# Patient Record
Sex: Male | Born: 1955 | Race: Asian | Hispanic: No | Smoking: Former smoker
Health system: Southern US, Community
[De-identification: ages and names within clinical notes are randomized; demographics above are authoritative.]

## PROBLEM LIST (undated history)

## (undated) DIAGNOSIS — T7840XA Allergy, unspecified, initial encounter: Secondary | ICD-10-CM

## (undated) HISTORY — DX: Allergy, unspecified, initial encounter: T78.40XA

## (undated) HISTORY — PX: PROSTATE SURGERY: SHX751

---

## 2011-12-04 ENCOUNTER — Ambulatory Visit (INDEPENDENT_AMBULATORY_CARE_PROVIDER_SITE_OTHER): Payer: PRIVATE HEALTH INSURANCE | Admitting: Family Medicine

## 2011-12-04 VITALS — BP 121/73 | HR 64 | Temp 97.5°F | Resp 16 | Ht 66.0 in | Wt 156.0 lb

## 2011-12-04 DIAGNOSIS — J309 Allergic rhinitis, unspecified: Secondary | ICD-10-CM

## 2011-12-04 DIAGNOSIS — J0100 Acute maxillary sinusitis, unspecified: Secondary | ICD-10-CM

## 2011-12-04 MED ORDER — FLUTICASONE PROPIONATE 50 MCG/ACT NA SUSP: NASAL | Status: DC

## 2011-12-04 MED ORDER — AMOXICILLIN-POT CLAVULANATE 875-125 MG PO TABS: 1.0000 | ORAL_TABLET | Freq: Two times a day (BID) | ORAL | Status: DC

## 2011-12-04 NOTE — Progress Notes (Signed)
   [address]   [place], [place]  [address]   [phone]  Subjective:    Patient ID: Peter Norman, male    DOB: July 08, 1955, 56 y.o.   MRN: [id number]  HPIThis 56 y.o. male presents for evaluation of cough.  Onset one month ago.  At time, took medication Tylenol for a few days with improvement.  Then recurrent symptoms for past month.  +fever Tmax low grade.  +sweats.  No chills.  +coughing  +ST scratchy but no ear pain.  No rhinorrhea; +nasal congestion; +congestion clear thin.  +sinus pressure.  History of allergic rhinitis; chronic allergies; +sneezing.  +PND.  +coughing; +sputum; no SOB.  No vomiting or diarrhea.  No rash.  Intermittent fatigue/malaise.  Takes Claritin daily.  No recent travel in past year.  PMH:  Allergies Rhinitis Psurg:  None All:  NKDA Medications: Claritin 10mg  daily Social:  Divorced; dating; 2 children; no grandchildren.  History of tobacco 1992.  +ETOH daily wine.  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     Review of Systems  Constitutional: Negative for fever, chills, diaphoresis and fatigue.  HENT: Positive for congestion, sore throat, sneezing, voice change, postnasal drip and sinus pressure. Negative for rhinorrhea and trouble swallowing.   Respiratory: Positive for cough. Negative for shortness of breath and wheezing.   Gastrointestinal: Negative for nausea, vomiting, abdominal pain and diarrhea.  Neurological: Negative for headaches.       Objective:   Physical Exam  Nursing note and vitals reviewed. Constitutional: He is oriented to person, place, and time. He appears well-developed and well-nourished. No distress.  HENT:  Head: Normocephalic and atraumatic.  Right Ear: External ear normal.  Left Ear: External ear normal.  Nose: Nose normal.  Mouth/Throat: Oropharynx is clear and moist. No oropharyngeal exudate.  Eyes: Conjunctivae normal and EOM are normal. Pupils are equal, round, and reactive to light.  Neck: Normal range of motion. Neck supple.    Cardiovascular: Normal rate, regular rhythm and normal heart sounds.   No murmur heard. Pulmonary/Chest: Effort normal and breath sounds normal. No respiratory distress. He has no wheezes. He has no rales.  Lymphadenopathy:    He has no cervical adenopathy.  Neurological: He is alert and oriented to person, place, and time. No cranial nerve deficit. He exhibits normal muscle tone.  Skin: Skin is warm and dry. No rash noted. He is not diaphoretic.  Psychiatric: He has a normal mood and affect. His behavior is normal. Judgment and thought content normal.       Assessment & Plan:   1. Allergic rhinitis, cause unspecified  fluticasone (FLONASE) 50 MCG/ACT nasal spray  2. Sinusitis, acute maxillary  amoxicillin-clavulanate (AUGMENTIN) 875-125 MG per tablet     1. Allergic Rhinitis: worsening; continue Claritin 10mg  daily; add Flonase; rx provided and instructed on use. 2.  Sinusitis Acute Maxillary:  New.  Rx for Augmentin provided.  Call if no improvement in two weeks.

## 2011-12-04 NOTE — Patient Instructions (Addendum)
1. Allergic rhinitis, cause unspecified  fluticasone (FLONASE) 50 MCG/ACT nasal spray  2. Sinusitis, acute maxillary  amoxicillin-clavulanate (AUGMENTIN) 875-125 MG per tablet   Sinusitis Sinuses are air pockets within the bones of your face. The growth of bacteria within a sinus leads to infection. The infection prevents the sinuses from draining. This infection is called sinusitis. SYMPTOMS  There will be different areas of pain depending on which sinuses have become infected.  The maxillary sinuses often produce pain beneath the eyes.   Frontal sinusitis may cause pain in the middle of the forehead and above the eyes.  Other problems (symptoms) include:  Toothaches.   Colored, pus-like (purulent) drainage from the nose.   Swelling, warmth, and tenderness over the sinus areas may be signs of infection.  TREATMENT  Sinusitis is most often determined by an exam.X-rays may be taken. If x-rays have been taken, make sure you obtain your results or find out how you are to obtain them. Your caregiver may give you medications (antibiotics). These are medications that will help kill the bacteria causing the infection. You may also be given a medication (decongestant) that helps to reduce sinus swelling.  HOME CARE INSTRUCTIONS   Only take over-the-counter or prescription medicines for pain, discomfort, or fever as directed by your caregiver.   Drink extra fluids. Fluids help thin the mucus so your sinuses can drain more easily.   Applying either moist heat or ice packs to the sinus areas may help relieve discomfort.   Use saline nasal sprays to help moisten your sinuses. The sprays can be found at your local drugstore.  SEEK IMMEDIATE MEDICAL CARE IF:  You have a fever.   You have increasing pain, severe headaches, or toothache.   You have nausea, vomiting, or drowsiness.   You develop unusual swelling around the face or trouble seeing.  MAKE SURE YOU:   Understand these  instructions.   Will watch your condition.   Will get help right away if you are not doing well or get worse.  Document Released: 03/01/2005 Document Revised: 02/18/2011 Document Reviewed: 09/28/2006 [facility] Patient Information 2012 [place], [place].

## 2011-12-06 NOTE — Progress Notes (Signed)
Reviewed and agree.

## 2012-03-29 ENCOUNTER — Encounter: Payer: Self-pay | Admitting: Internal Medicine

## 2012-03-29 ENCOUNTER — Ambulatory Visit (INDEPENDENT_AMBULATORY_CARE_PROVIDER_SITE_OTHER): Payer: PRIVATE HEALTH INSURANCE | Admitting: Internal Medicine

## 2012-03-29 VITALS — BP 129/79 | HR 79 | Temp 98.6°F | Resp 16 | Ht 67.0 in | Wt 154.0 lb

## 2012-03-29 DIAGNOSIS — J309 Allergic rhinitis, unspecified: Secondary | ICD-10-CM | POA: Insufficient documentation

## 2012-03-29 DIAGNOSIS — M779 Enthesopathy, unspecified: Secondary | ICD-10-CM

## 2012-03-29 DIAGNOSIS — Z0000 Encounter for general adult medical examination without abnormal findings: Secondary | ICD-10-CM

## 2012-03-29 DIAGNOSIS — J329 Chronic sinusitis, unspecified: Secondary | ICD-10-CM

## 2012-03-29 DIAGNOSIS — Z23 Encounter for immunization: Secondary | ICD-10-CM

## 2012-03-29 DIAGNOSIS — Z1211 Encounter for screening for malignant neoplasm of colon: Secondary | ICD-10-CM

## 2012-03-29 LAB — CBC WITH DIFFERENTIAL/PLATELET
Basophils Absolute: 0 10*3/uL (ref 0.0–0.1)
Eosinophils Absolute: 0.2 10*3/uL (ref 0.0–0.7)
Eosinophils Relative: 2 % (ref 0–5)
Lymphocytes Relative: 14 % (ref 12–46)
Lymphs Abs: 1.6 10*3/uL (ref 0.7–4.0)
Neutrophils Relative %: 74 % (ref 43–77)
Platelets: 233 10*3/uL (ref 150–400)
RBC: 5.07 MIL/uL (ref 4.22–5.81)
RDW: 13.2 % (ref 11.5–15.5)
WBC: 11.5 10*3/uL — ABNORMAL HIGH (ref 4.0–10.5)

## 2012-03-29 LAB — COMPREHENSIVE METABOLIC PANEL
ALT: 69 U/L — ABNORMAL HIGH (ref 0–53)
AST: 37 U/L (ref 0–37)
Albumin: 4.5 g/dL (ref 3.5–5.2)
CO2: 25 mEq/L (ref 19–32)
Calcium: 9.1 mg/dL (ref 8.4–10.5)
Chloride: 103 mEq/L (ref 96–112)
Potassium: 4 mEq/L (ref 3.5–5.3)
Sodium: 138 mEq/L (ref 135–145)
Total Protein: 7.2 g/dL (ref 6.0–8.3)

## 2012-03-29 LAB — LIPID PANEL
HDL: 61 mg/dL (ref >39)
LDL Cholesterol: 138 mg/dL — ABNORMAL HIGH (ref 0–99)
Triglycerides: 94 mg/dL (ref <150)

## 2012-03-29 LAB — IFOBT (OCCULT BLOOD): IFOBT: NEGATIVE

## 2012-03-29 LAB — POCT URINALYSIS DIPSTICK
Glucose, UA: NEGATIVE
Nitrite, UA: NEGATIVE
Protein, UA: NEGATIVE
Spec Grav, UA: 1.015
Urobilinogen, UA: 0.2
pH, UA: 7

## 2012-03-29 MED ORDER — AMOXICILLIN 875 MG PO TABS: 875.0000 mg | ORAL_TABLET | Freq: Two times a day (BID) | ORAL | Status: DC

## 2012-03-29 MED ORDER — CELECOXIB 200 MG PO CAPS: 200.0000 mg | ORAL_CAPSULE | Freq: Every day | ORAL | Status: DC

## 2012-03-29 NOTE — Progress Notes (Signed)
Subjective:    Patient ID: Peter Norman, male    DOB: Jan 18, 1956, 57 y.o.   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 Colonoscopy 12-14 years ago normal Review of Systems 13 point review of systems negative    Objective:   Physical Exam BP 129/79  Pulse 79  Temp 98.6 F (37 C)  Resp 16  Ht 5\' 7"  (1.702 m)  Wt 154 lb (69.854 kg)  BMI 24.12 kg/m2 No acute distress Pupils equal reactive to light and accommodation/wears glasses/EOMs conjugate TMs clear/ Nares boggy/purulent mucus Throat clear/dentition good No nodes or thyromegaly/neck supple Heart regular without murmurs rubs clicks or gallops Lungs clear Spine straight Abdomen soft with no organomegaly or masses Rectal with no masses Prostate soft and symmetrical Extremities clear Neurological intact Mood appropriate   Results for orders placed in visit on 03/29/12  CBC WITH DIFFERENTIAL      Component Value Range   WBC 11.5 (*) 4.0 - 10.5 K/uL   RBC 5.07  4.22 - 5.81 MIL/uL   Hemoglobin 15.9  13.0 - 17.0 g/dL   HCT 29.5  28.4 - 13.2 %   MCV 87.8  78.0 - 100.0 fL   MCH 31.4  26.0 - 34.0 pg   MCHC 35.7  30.0 - 36.0 g/dL   RDW 44.0  10.2 - 72.5 %   Platelets 233  150 - 400 K/uL   Neutrophils Relative 74  43 - 77 %   Neutro Abs 8.5 (*) 1.7 - 7.7 K/uL   Lymphocytes Relative 14  12 - 46 %   Lymphs Abs 1.6  0.7 - 4.0 K/uL   Monocytes Relative 10  3 - 12 %   Monocytes Absolute 1.1 (*) 0.1 - 1.0 K/uL   Eosinophils Relative 2  0 - 5 %   Eosinophils Absolute 0.2  0.0 - 0.7 K/uL   Basophils Relative 0  0 - 1 %   Basophils Absolute 0.0   0.0 - 0.1 K/uL   Smear Review Criteria for review not met        Component Value Range   IFOBT Negative    POCT URINALYSIS DIPSTICK      Component Value Range   Color, UA yellow     Clarity, UA clear     Glucose, UA neg     Bilirubin, UA neg     Ketones, UA neg     Spec Grav, UA 1.015     Blood, UA trace     pH, UA 7.0     Protein, UA neg     Urobilinogen, UA 0.2     Nitrite, UA neg     Leukocytes, UA Negative         Assessment & Plan:  Annual physical exam Problems 1 allergic rhinitis-doing well on Flonase and will continue that Problem #2 sinusitis-amoxicillin Problem 3 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 amoxicillin (AMOXIL) 875 MG tablet    Sig:  Take 1 tablet (875 mg total) by mouth 2 (two) times daily.    Dispense:  20 tablet    Refill:  0  . celecoxib (CELEBREX) 200 MG capsule    Sig: Take 1 capsule (200 mg total) by mouth daily.    Dispense:  30 capsule    Refill:  5

## 2012-03-30 ENCOUNTER — Encounter: Payer: Self-pay | Admitting: Internal Medicine

## 2012-03-30 LAB — PSA: PSA: 3.5 ng/mL (ref <=4.00)

## 2012-04-03 ENCOUNTER — Encounter: Payer: Self-pay | Admitting: Internal Medicine

## 2012-04-11 ENCOUNTER — Ambulatory Visit (AMBULATORY_SURGERY_CENTER): Payer: PRIVATE HEALTH INSURANCE | Admitting: *Deleted

## 2012-04-11 VITALS — Ht 67.0 in | Wt 160.0 lb

## 2012-04-11 DIAGNOSIS — Z1211 Encounter for screening for malignant neoplasm of colon: Secondary | ICD-10-CM

## 2012-04-11 MED ORDER — PEG-KCL-NACL-NASULF-NA ASC-C 100 G PO SOLR: ORAL | Status: DC

## 2012-04-11 NOTE — Progress Notes (Signed)
No egg or soy allergy 

## 2012-04-13 ENCOUNTER — Encounter: Payer: Self-pay | Admitting: Internal Medicine

## 2012-04-19 ENCOUNTER — Encounter: Payer: Self-pay | Admitting: Internal Medicine

## 2012-04-19 ENCOUNTER — Ambulatory Visit (AMBULATORY_SURGERY_CENTER): Payer: PRIVATE HEALTH INSURANCE | Admitting: Internal Medicine

## 2012-04-19 VITALS — BP 118/78 | HR 65 | Temp 97.7°F | Resp 20 | Ht 67.0 in | Wt 160.0 lb

## 2012-04-19 DIAGNOSIS — D126 Benign neoplasm of colon, unspecified: Secondary | ICD-10-CM

## 2012-04-19 DIAGNOSIS — Z1211 Encounter for screening for malignant neoplasm of colon: Secondary | ICD-10-CM

## 2012-04-19 MED ORDER — SODIUM CHLORIDE 0.9 % IV SOLN: 500.0000 mL | INTRAVENOUS | Status: DC

## 2012-04-19 NOTE — Progress Notes (Signed)
1635 a/ox3 pleased with MAC report to [employer]

## 2012-04-19 NOTE — Progress Notes (Signed)
Patient did not experience any of the following events: a burn prior to discharge; a fall within the facility; wrong site/side/patient/procedure/implant event; or a hospital transfer or hospital admission upon discharge from the facility. (G8907) Patient did not have preoperative order for IV antibiotic SSI prophylaxis. (G8918)  

## 2012-04-19 NOTE — Patient Instructions (Addendum)

## 2012-04-19 NOTE — Op Note (Signed)
[facility] [address] [place], [address]   COLONOSCOPY PROCEDURE REPORT  PATIENT: Peter Norman, Peter Norman  MR#: [id number] BIRTHDATE: [birth year] , 56  yrs. old GENDER: Male ENDOSCOPIST: Beverley Fiedler, MD REFERRED NW:GNFAO, Chris PROCEDURE DATE:  04/19/2012 PROCEDURE:   Colonoscopy with snare polypectomy ASA CLASS:   Class II INDICATIONS:average risk screening. MEDICATIONS: MAC sedation, administered by CRNA, Propofol (Diprivan), and propofol (Diprivan) 200mg  IV  DESCRIPTION OF PROCEDURE:   After the risks benefits and alternatives of the procedure were thoroughly explained, informed consent was obtained.  A digital rectal exam revealed no rectal mass.   The LB CF-[id number]  endoscope was introduced through the anus and advanced to the terminal ileum which was intubated for a short distance. No adverse events experienced.   The quality of the prep was good, using MoviPrep  The instrument was then slowly withdrawn as the colon was fully examined.   COLON FINDINGS: The mucosa appeared normal in the terminal ileum. Two sessile polyps measuring 3-4 mm in size were found at the cecum and in the sigmoid colon.  Polypectomy was performed using cold snare.  All resections were complete and all polyp tissue was completely retrieved.   Mild diverticulosis was noted at the cecum and in the ascending colon.  Retroflexion was not performed due to a narrow rectal vault, though adequate views of anorectal junction obtained. The time to cecum=1 minutes 45 seconds.  Withdrawal time=13 minutes 02 seconds.  The scope was withdrawn and the procedure completed. COMPLICATIONS: There were no complications.  ENDOSCOPIC IMPRESSION: 1.   Normal mucosa in the terminal ileum 2.   Two sessile polyps measuring 3-4 mm in size were found at the cecum and in the sigmoid colon; Polypectomy was performed using cold snare 3.   Mild diverticulosis was noted at the cecum and in the  ascending colon  RECOMMENDATIONS: 1.  Await pathology results 2.  High fiber diet 3.  If the polyps removed today are proven to be adenomatous (pre-cancerous) polyps, you will need a repeat colonoscopy in 5 years.  Otherwise you should continue to follow colorectal cancer screening guidelines for "routine risk" patients with colonoscopy in 10 years.  You will receive a letter within 1-2 weeks with the results of your biopsy as well as final recommendations.  Please call my office if you have not received a letter after 3 weeks.   eSigned:  Beverley Fiedler, MD 04/19/2012 4:38 PM cc: Robert Bellow, MD and The Patient

## 2012-04-20 ENCOUNTER — Telehealth: Payer: Self-pay | Admitting: *Deleted

## 2012-04-20 NOTE — Telephone Encounter (Signed)
  Follow up Call-  Call back number 04/19/2012  Post procedure Call Back phone  # [phone]/CELL [phone] MAY LEAVE MESSAGEON CELL PHONE  Permission to leave phone message Yes     Patient questions:  Do you have a fever, pain , or abdominal swelling? no Pain Score  0 *  Have you tolerated food without any problems? yes  Have you been able to return to your normal activities? yes  Do you have any questions about your discharge instructions: Diet   no Medications  no Follow up visit  no  Do you have questions or concerns about your Care? no  Actions: * If pain score is 4 or above: No action needed, pain <4.

## 2012-04-25 ENCOUNTER — Encounter: Payer: Self-pay | Admitting: Internal Medicine

## 2013-05-23 ENCOUNTER — Encounter: Payer: Self-pay | Admitting: Internal Medicine

## 2013-05-23 ENCOUNTER — Ambulatory Visit (INDEPENDENT_AMBULATORY_CARE_PROVIDER_SITE_OTHER): Payer: BC Managed Care – PPO | Admitting: Internal Medicine

## 2013-05-23 VITALS — BP 116/72 | HR 67 | Temp 98.0°F | Resp 16 | Ht 66.0 in | Wt 157.2 lb

## 2013-05-23 DIAGNOSIS — Z0000 Encounter for general adult medical examination without abnormal findings: Secondary | ICD-10-CM

## 2013-05-23 LAB — POCT URINALYSIS DIPSTICK
Bilirubin, UA: NEGATIVE
Blood, UA: NEGATIVE
GLUCOSE UA: NEGATIVE
Ketones, UA: NEGATIVE
Leukocytes, UA: NEGATIVE
NITRITE UA: NEGATIVE
PROTEIN UA: NEGATIVE
SPEC GRAV UA: 1.015
UROBILINOGEN UA: 0.2
pH, UA: 7.5

## 2013-05-23 LAB — LIPID PANEL
CHOL/HDL RATIO: 4.6 ratio
CHOLESTEROL: 232 mg/dL — AB (ref 0–200)
HDL: 50 mg/dL (ref >39)
LDL CALC: 151 mg/dL — AB (ref 0–99)
Triglycerides: 153 mg/dL — ABNORMAL HIGH (ref <150)
VLDL: 31 mg/dL (ref 0–40)

## 2013-05-23 LAB — COMPREHENSIVE METABOLIC PANEL
ALBUMIN: 4.5 g/dL (ref 3.5–5.2)
ALK PHOS: 42 U/L (ref 39–117)
ALT: 33 U/L (ref 0–53)
AST: 21 U/L (ref 0–37)
BILIRUBIN TOTAL: 0.8 mg/dL (ref 0.2–1.2)
BUN: 20 mg/dL (ref 6–23)
CO2: 27 mEq/L (ref 19–32)
Calcium: 9.1 mg/dL (ref 8.4–10.5)
Chloride: 103 mEq/L (ref 96–112)
Creat: 0.82 mg/dL (ref 0.50–1.35)
GLUCOSE: 107 mg/dL — AB (ref 70–99)
POTASSIUM: 4.2 meq/L (ref 3.5–5.3)
SODIUM: 139 meq/L (ref 135–145)
TOTAL PROTEIN: 7.3 g/dL (ref 6.0–8.3)

## 2013-05-23 LAB — CBC WITH DIFFERENTIAL/PLATELET
BASOS PCT: 1 % (ref 0–1)
Basophils Absolute: 0.1 10*3/uL (ref 0.0–0.1)
EOS ABS: 0.6 10*3/uL (ref 0.0–0.7)
Eosinophils Relative: 9 % — ABNORMAL HIGH (ref 0–5)
HCT: 45.4 % (ref 39.0–52.0)
HEMOGLOBIN: 15.7 g/dL (ref 13.0–17.0)
Lymphocytes Relative: 25 % (ref 12–46)
Lymphs Abs: 1.8 10*3/uL (ref 0.7–4.0)
MCH: 31.2 pg (ref 26.0–34.0)
MCHC: 34.6 g/dL (ref 30.0–36.0)
MCV: 90.1 fL (ref 78.0–100.0)
MONOS PCT: 8 % (ref 3–12)
Monocytes Absolute: 0.6 10*3/uL (ref 0.1–1.0)
NEUTROS ABS: 4 10*3/uL (ref 1.7–7.7)
NEUTROS PCT: 57 % (ref 43–77)
PLATELETS: 250 10*3/uL (ref 150–400)
RBC: 5.04 MIL/uL (ref 4.22–5.81)
RDW: 12.8 % (ref 11.5–15.5)
WBC: 7 10*3/uL (ref 4.0–10.5)

## 2013-05-23 NOTE — Progress Notes (Addendum)
This chart was scribed for Peter Koyanagi, MD by Eston Mould, ED Scribe. This patient was seen in room Room/bed 24 and the patient's care was started at 2:25 PM. Subjective:    Patient ID: Peter Norman, male    DOB: Mar 29, 1955, 58 y.o.   MRN: [id number] Chief Complaint  Patient presents with  . Annual Exam   HPI Peter Norman is a 58 y.o. male who presents to the [facility] for annual exam. Pt states he has had a good year so far and reports being able to stay healthy. Pt denies having problems with his heart rate and strength. He states he has had some trouble with his sleep and reports sleeping 4-5 hours daily. He suspects the amount sleep is worrying about sleep and his 3 small dogs. Pt states he will plan to retire in 2 more years.  Pt states he takes Claritin and reports having relief. He states he uses his nasal spray PRN. Pt states his Colonoscopy was WNL. Pt denies having visual changes and reports having the same glasses.  Patient Active Problem List   Diagnosis Date Noted  . AR (allergic rhinitis) 03/29/2012  Current outpatient prescriptions:fluticasone (FLONASE) 50 MCG/ACT nasal spray, 2 sprays into each nostril daily for allergies, Disp: 16 g, Rfl: 11;  loratadine (CLARITIN) 10 MG tablet, Take 10 mg by mouth daily., Disp: , Rfl: ;  acetaminophen (TYLENOL) 500 MG tablet, Take 500 mg by mouth every 6 (six) hours as needed., Disp: , Rfl: ;  celecoxib (CELEBREX) 200 MG capsule, Take 1 capsule (200 mg total) by mouth daily., Disp: 30 capsule, Rfl: 5 peg 3350 powder (MOVIPREP) 100 G SOLR, Moviprep as directed, no substitutions, Disp: 1 kit, Rfl: 0  Review of Systems  Constitutional: Negative.   HENT: Negative.   Eyes: Negative.   Respiratory: Negative.   Cardiovascular: Negative.   Gastrointestinal: Negative.   Genitourinary: Negative.   Musculoskeletal: Negative.   Neurological: Negative.   Hematological: Negative.   Psychiatric/Behavioral: Negative.     Last Tdap was  2014. Objective:   Physical Exam  Nursing note and vitals reviewed. Constitutional: He is oriented to person, place, and time. He appears well-developed and well-nourished. No distress.  HENT:  Head: Normocephalic and atraumatic.  Right Ear: External ear normal.  Left Ear: External ear normal.  Nose: Nose normal.  Mouth/Throat: Oropharynx is clear and moist.  Poor dentition with lots of plaque.  Eyes: Conjunctivae and EOM are normal. Pupils are equal, round, and reactive to light.  Neck: Normal range of motion. Neck supple. No thyromegaly present.  Cardiovascular: Normal rate, regular rhythm, normal heart sounds and intact distal pulses.  Exam reveals no gallop and no friction rub.   No murmur heard. Pulmonary/Chest: Effort normal and breath sounds normal. No respiratory distress. He has no wheezes. He has no rales. He exhibits no tenderness.  Abdominal: Soft. Bowel sounds are normal. He exhibits no mass. There is no tenderness. There is no rebound and no guarding.  Musculoskeletal: He exhibits no edema and no tenderness.  Varicosity to lower extremities.  Lymphadenopathy:    He has no cervical adenopathy.  Neurological: He is alert and oriented to person, place, and time. No cranial nerve deficit.  Skin: Skin is warm. No rash noted.  Psychiatric: He has a normal mood and affect. His behavior is normal. Judgment and thought content normal.    BP 116/72  Pulse 67  Temp(Src) 98 F (36.7 C) (Oral)  Resp 16  Ht 5' 6" (1.676  m)  Wt 157 lb 3.2 oz (71.305 kg)  BMI 25.38 kg/m2  SpO2 96% Assessment & Plan:  Routine general medical examination at a health care facility - Plan: CBC with Differential, Comprehensive metabolic panel, PSA, Lipid panel    I have completed the patient encounter in its entirety as documented by the scribe, with editing by me where necessary. Zunairah Devers P. Laney Pastor, M.D.

## 2013-05-24 LAB — PSA: PSA: 3.35 ng/mL (ref <=4.00)

## 2013-05-26 ENCOUNTER — Encounter: Payer: Self-pay | Admitting: Internal Medicine

## 2013-06-19 MED ORDER — HALOBETASOL PROPIONATE 0.05 % EX CREA: TOPICAL_CREAM | Freq: Two times a day (BID) | CUTANEOUS | Status: DC

## 2013-06-19 NOTE — Addendum Note (Signed)
Addended by: [name] on: 06/19/2013 12:37 PM   Modules accepted: Orders

## 2014-06-05 ENCOUNTER — Encounter: Payer: Self-pay | Admitting: Internal Medicine

## 2014-07-03 ENCOUNTER — Encounter: Payer: Self-pay | Admitting: Internal Medicine

## 2014-07-03 ENCOUNTER — Ambulatory Visit (INDEPENDENT_AMBULATORY_CARE_PROVIDER_SITE_OTHER): Payer: BLUE CROSS/BLUE SHIELD | Admitting: Internal Medicine

## 2014-07-03 VITALS — BP 123/80 | HR 77 | Temp 98.5°F | Resp 16 | Ht 67.0 in | Wt 156.0 lb

## 2014-07-03 DIAGNOSIS — Z130 Encounter for screening for diseases of the blood and blood-forming organs and certain disorders involving the immune mechanism: Secondary | ICD-10-CM | POA: Diagnosis not present

## 2014-07-03 DIAGNOSIS — Z0000 Encounter for general adult medical examination without abnormal findings: Secondary | ICD-10-CM

## 2014-07-03 DIAGNOSIS — Z1389 Encounter for screening for other disorder: Secondary | ICD-10-CM | POA: Diagnosis not present

## 2014-07-03 DIAGNOSIS — Z13220 Encounter for screening for lipoid disorders: Secondary | ICD-10-CM | POA: Diagnosis not present

## 2014-07-03 DIAGNOSIS — K089 Disorder of teeth and supporting structures, unspecified: Secondary | ICD-10-CM

## 2014-07-03 DIAGNOSIS — I8393 Asymptomatic varicose veins of bilateral lower extremities: Secondary | ICD-10-CM

## 2014-07-03 DIAGNOSIS — Z139 Encounter for screening, unspecified: Secondary | ICD-10-CM | POA: Diagnosis not present

## 2014-07-03 DIAGNOSIS — Z125 Encounter for screening for malignant neoplasm of prostate: Secondary | ICD-10-CM

## 2014-07-03 DIAGNOSIS — K088 Other specified disorders of teeth and supporting structures: Secondary | ICD-10-CM | POA: Diagnosis not present

## 2014-07-03 LAB — CBC
HCT: 45.5 % (ref 39.0–52.0)
HEMOGLOBIN: 15.4 g/dL (ref 13.0–17.0)
MCH: 31 pg (ref 26.0–34.0)
MCHC: 33.8 g/dL (ref 30.0–36.0)
MCV: 91.7 fL (ref 78.0–100.0)
MPV: 10.5 fL (ref 8.6–12.4)
Platelets: 243 10*3/uL (ref 150–400)
RBC: 4.96 MIL/uL (ref 4.22–5.81)
RDW: 12.8 % (ref 11.5–15.5)
WBC: 7.2 10*3/uL (ref 4.0–10.5)

## 2014-07-03 LAB — POCT URINALYSIS DIPSTICK
Bilirubin, UA: NEGATIVE
GLUCOSE UA: NEGATIVE
KETONES UA: NEGATIVE
Leukocytes, UA: NEGATIVE
Nitrite, UA: NEGATIVE
Protein, UA: NEGATIVE
RBC UA: NEGATIVE
SPEC GRAV UA: 1.015
UROBILINOGEN UA: 0.2
pH, UA: 6

## 2014-07-03 LAB — COMPREHENSIVE METABOLIC PANEL
ALBUMIN: 4.2 g/dL (ref 3.5–5.2)
ALK PHOS: 43 U/L (ref 39–117)
ALT: 27 U/L (ref 0–53)
AST: 19 U/L (ref 0–37)
BILIRUBIN TOTAL: 0.3 mg/dL (ref 0.2–1.2)
BUN: 31 mg/dL — AB (ref 6–23)
CALCIUM: 9.2 mg/dL (ref 8.4–10.5)
CHLORIDE: 103 meq/L (ref 96–112)
CO2: 24 mEq/L (ref 19–32)
CREATININE: 0.87 mg/dL (ref 0.50–1.35)
Glucose, Bld: 106 mg/dL — ABNORMAL HIGH (ref 70–99)
Potassium: 4.3 mEq/L (ref 3.5–5.3)
Sodium: 139 mEq/L (ref 135–145)
TOTAL PROTEIN: 7.2 g/dL (ref 6.0–8.3)

## 2014-07-03 LAB — LIPID PANEL
CHOL/HDL RATIO: 3.7 ratio
Cholesterol: 194 mg/dL (ref 0–200)
HDL: 53 mg/dL (ref >=40)
LDL Cholesterol: 124 mg/dL — ABNORMAL HIGH (ref 0–99)
Triglycerides: 83 mg/dL (ref <150)
VLDL: 17 mg/dL (ref 0–40)

## 2014-07-03 NOTE — Patient Instructions (Signed)
Please consider seeing a dentist Find some time for yourself to have fun!

## 2014-07-03 NOTE — Progress Notes (Signed)
Subjective:    Patient ID: Peter Norman, male    DOB: 1955-12-31, 59 y.o.   MRN: [id number]  HPI Patient presents today for CPE. 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 He has a long time girlfriend and they have multiple dogs. His children are grown.  Last CPE- 3/15 Tdap- 03/29/12 Colonoscopy- 2/14 Dentist- no- the last time he went several years ago, he had a great deal of pain and bleeding. Brushes and flosses daily.  Eye- 3-4 years ago   He has very few complaints today. 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 He reports that he has been to a vascular person and was told he would need to be out of work for 1-2 months. He will consider this when he is retired. He denies pain.   Past Medical History  Diagnosis Date  . Allergy    History reviewed. No pertinent past surgical history. Family History  Problem Relation Age of Onset  . Diabetes Mother   . Cancer Mother   . Heart disease Father   . Diabetes Father   . Colon cancer Neg Hx   . Esophageal cancer Neg Hx   . Rectal cancer Neg Hx   . Stomach cancer Neg Hx    History  Substance Use Topics  . Smoking status: Former Smoker    Quit date: 12/04/1990  . Smokeless tobacco: Never Used  . Alcohol Use: 1.8 oz/week    3 Glasses of wine per week     Comment: occasional glass of wine     Review of Systems  Constitutional: Negative.   HENT: Negative.   Eyes: Negative.   Respiratory: Negative.   Cardiovascular: Positive for leg swelling (right ).  Gastrointestinal: Negative.   Endocrine: Negative.   Genitourinary: Positive for frequency.  Musculoskeletal: Negative.   Skin: Negative.   Allergic/Immunologic: Positive for environmental allergies.  Neurological: Negative.   Hematological: Negative.   Psychiatric/Behavioral: Negative.        Objective:   Physical Exam  Constitutional: He is oriented to person, place, and time. He appears well-developed and well-nourished.    HENT:  Head: Normocephalic and atraumatic.  Right Ear: External ear normal.  Left Ear: External ear normal.  Nose: Nose normal.  Mouth/Throat: Abnormal dentition.  Multiple missing and darkened teeth.   Eyes: Conjunctivae are normal. Pupils are equal, round, and reactive to light.  Neck: Normal range of motion. Neck supple. No JVD present. No thyromegaly present.  Cardiovascular: Normal rate, regular rhythm, normal heart sounds and intact distal pulses.   Pulmonary/Chest: Effort normal and breath sounds normal.  Abdominal: Soft. Bowel sounds are normal.  Musculoskeletal: Normal range of motion. He exhibits no edema or tenderness.  Lymphadenopathy:    He has no cervical adenopathy.  Neurological: He is alert and oriented to person, place, and time. He has normal reflexes.  Skin: Skin is warm and dry.  Right anterior LE with multiple, large varicosities. No pedal edema.   Psychiatric: He has a normal mood and affect. His behavior is normal. Judgment and thought content normal.  Vitals reviewed.   BP 123/80 mmHg  Pulse 77  Temp(Src) 98.5 F (36.9 C)  Resp 16  Ht 5\' 7"  (1.702 m)  Wt 156 lb (70.761 kg)  BMI 24.43 kg/m2  SpO2 95%     Assessment & Plan:  Discussed with Dr. Laney Pastor  1. Annual physical exam  2. Screening for prostate cancer - PSA  3. Screening for cholesterol level - Lipid panel  4. Screening for deficiency anemia - CBC  5. Screening for hematuria or proteinuria - POCT urinalysis dipstick  6. Screening for nephropathy - Comprehensive metabolic panel  7. Poor dentition -encouraged him to get regular dental care, to floss daily and brush twice a day  8. Varicose veins of legs -discussed need for follow up, patient does not want to be out of work. Suggested support hose.   Elby Beck, FNP-BC  [facility]  07/05/2014 9:46 PM I have participated in the care of this patient with the [facility] Provider and agree with Diagnosis and Plan as documented. Robert P. Laney Pastor, M.D.   Add labs 4/24 Results for orders placed or performed in visit on 07/03/14  CBC  Result Value Ref Range   WBC 7.2 4.0 - 10.5 K/uL   RBC 4.96 4.22 - 5.81 MIL/uL   Hemoglobin 15.4 13.0 - 17.0 g/dL   HCT 45.5 39.0 - 52.0 %   MCV 91.7 78.0 - 100.0 fL   MCH 31.0 26.0 - 34.0 pg   MCHC 33.8 30.0 - 36.0 g/dL   RDW 12.8 11.5 - 15.5 %   Platelets 243 150 - 400 K/uL   MPV 10.5 8.6 - 12.4 fL  Comprehensive metabolic panel  Result Value Ref Range   Sodium 139 135 - 145 mEq/L   Potassium 4.3 3.5 - 5.3 mEq/L   Chloride 103 96 - 112 mEq/L   CO2 24 19 - 32 mEq/L   Glucose, Bld 106 (H) 70 - 99 mg/dL   BUN 31 (H) 6 - 23 mg/dL   Creat 0.87 0.50 - 1.35 mg/dL   Total Bilirubin 0.3 0.2 - 1.2 mg/dL   Alkaline Phosphatase 43 39 - 117 U/L   AST 19 0 - 37 U/L   ALT 27 0 - 53 U/L   Total Protein 7.2 6.0 - 8.3 g/dL   Albumin 4.2 3.5 - 5.2 g/dL   Calcium 9.2 8.4 - 10.5 mg/dL  Lipid panel  Result Value Ref Range   Cholesterol 194 0 - 200 mg/dL   Triglycerides 83 <150 mg/dL   HDL 53 >=40 mg/dL   Total CHOL/HDL Ratio 3.7 Ratio   VLDL 17 0 - 40 mg/dL   LDL Cholesterol 124 (H) 0 - 99 mg/dL  PSA  Result Value Ref Range   PSA 3.57 <=4.00 ng/mL  POCT urinalysis dipstick  Result Value Ref Range   Color, UA Yellow    Clarity, UA Clear    Glucose, UA Negative    Bilirubin, UA Negative    Ketones, UA Negative    Spec Grav, UA 1.015    Blood, UA Negative    pH, UA 6.0    Protein, UA Negative    Urobilinogen, UA 0.2    Nitrite, UA Negative    Leukocytes, UA Negative

## 2014-07-04 LAB — PSA: PSA: 3.57 ng/mL (ref <=4.00)

## 2014-07-05 ENCOUNTER — Encounter: Payer: Self-pay | Admitting: Family Medicine

## 2016-08-04 ENCOUNTER — Other Ambulatory Visit: Payer: Self-pay | Admitting: Urology

## 2016-08-24 NOTE — Patient Instructions (Signed)
Peter Norman  08/24/2016   Your procedure is scheduled on: 08-30-16   Report to [facility] Main  Entrance Take [place] Elevators to 3rd floor to [place] at 05:15 AM.   Call this number if you have problems the morning of surgery [phone]    Remember: ONLY 1 PERSON MAY GO WITH YOU TO SHORT STAY TO GET  READY MORNING OF [place].  Do not eat food or drink liquids :After Midnight.     Take these medicines the morning of surgery with A SIP OF WATER: None                                You may not have any metal on your body including hair pins and              piercings  Do not wear jewelry, make-up, lotions, powders or perfume.              Men may shave face and neck.   Do not bring valuables to the hospital. [place].  Contacts, dentures or bridgework may not be worn into surgery.  Leave suitcase in the car. After surgery it may be brought to your room.     Please read over the following fact sheets you were given: _____________________________________________________________________             [facility] - Preparing for Surgery Before surgery, you can play an important role.  Because skin is not sterile, your skin needs to be as free of germs as possible.  You can reduce the number of germs on your skin by washing with CHG (chlorahexidine gluconate) soap before surgery.  CHG is an antiseptic cleaner which kills germs and bonds with the skin to continue killing germs even after washing. Please DO NOT use if you have an allergy to CHG or antibacterial soaps.  If your skin becomes reddened/irritated stop using the CHG and inform your nurse when you arrive at Short Stay. Do not shave (including legs and underarms) for at least 48 hours prior to the first CHG shower.  You may shave your face/neck. Please follow these instructions carefully:  1.  Shower with CHG Soap the night before surgery and  the  morning of Surgery.  2.  If you choose to wash your hair, wash your hair first as usual with your  normal  shampoo.  3.  After you shampoo, rinse your hair and body thoroughly to remove the  shampoo.                           4.  Use CHG as you would any other liquid soap.  You can apply chg directly  to the skin and wash                       Gently with a scrungie or clean washcloth.  5.  Apply the CHG Soap to your body ONLY FROM THE NECK DOWN.   Do not use on face/ open  Wound or open sores. Avoid contact with eyes, ears mouth and genitals (private parts).                       Wash face,  Genitals (private parts) with your normal soap.             6.  Wash thoroughly, paying special attention to the area where your surgery  will be performed.  7.  Thoroughly rinse your body with warm water from the neck down.  8.  DO NOT shower/wash with your normal soap after using and rinsing off  the CHG Soap.                9.  Pat yourself dry with a clean towel.            10.  Wear clean pajamas.            11.  Place clean sheets on your bed the night of your first shower and do not  sleep with pets. Day of Surgery : Do not apply any lotions/deodorants the morning of surgery.  Please wear clean clothes to the hospital/surgery center.  FAILURE TO FOLLOW THESE INSTRUCTIONS MAY RESULT IN THE CANCELLATION OF YOUR SURGERY PATIENT SIGNATURE_________________________________  NURSE SIGNATURE__________________________________  ________________________________________________________________________   Peter Norman  An incentive spirometer is a tool that can help keep your lungs clear and active. This tool measures how well you are filling your lungs with each breath. Taking long deep breaths may help reverse or decrease the chance of developing breathing (pulmonary) problems (especially infection) following:  A long period of time when you are unable to move or be  active. BEFORE THE PROCEDURE   If the spirometer includes an indicator to show your best effort, your nurse or respiratory therapist will set it to a desired goal.  If possible, sit up straight or lean slightly forward. Try not to slouch.  Hold the incentive spirometer in an upright position. INSTRUCTIONS FOR USE  1. Sit on the edge of your bed if possible, or sit up as far as you can in bed or on a chair. 2. Hold the incentive spirometer in an upright position. 3. Breathe out normally. 4. Place the mouthpiece in your mouth and seal your lips tightly around it. 5. Breathe in slowly and as deeply as possible, raising the piston or the ball toward the top of the column. 6. Hold your breath for 3-5 seconds or for as long as possible. Allow the piston or ball to fall to the bottom of the column. 7. Remove the mouthpiece from your mouth and breathe out normally. 8. Rest for a few seconds and repeat Steps 1 through 7 at least 10 times every 1-2 hours when you are awake. Take your time and take a few normal breaths between deep breaths. 9. The spirometer may include an indicator to show your best effort. Use the indicator as a goal to work toward during each repetition. 10. After each set of 10 deep breaths, practice coughing to be sure your lungs are clear. If you have an incision (the cut made at the time of surgery), support your incision when coughing by placing a pillow or rolled up towels firmly against it. Once you are able to get out of bed, walk around indoors and cough well. You may stop using the incentive spirometer when instructed by your caregiver.  RISKS AND COMPLICATIONS  Take your time so you do not get  dizzy or light-headed.  If you are in pain, you may need to take or ask for pain medication before doing incentive spirometry. It is harder to take a deep breath if you are having pain. AFTER USE  Rest and breathe slowly and easily.  It can be helpful to keep track of a log of  your progress. Your caregiver can provide you with a simple table to help with this. If you are using the spirometer at home, follow these instructions: [place] IF:   You are having difficultly using the spirometer.  You have trouble using the spirometer as often as instructed.  Your pain medication is not giving enough relief while using the spirometer.  You develop fever of 100.5 F (38.1 C) or higher. SEEK IMMEDIATE MEDICAL CARE IF:   You cough up bloody sputum that had not been present before.  You develop fever of 102 F (38.9 C) or greater.  You develop worsening pain at or near the incision site. MAKE SURE YOU:   Understand these instructions.  Will watch your condition.  Will get help right away if you are not doing well or get worse. Document Released: 07/12/2006 Document Revised: 05/24/2011 Document Reviewed: 09/12/2006 Exit[employer] Patient Information 2014 [employer], [place].   ________________________________________________________________________  WHAT IS A BLOOD TRANSFUSION? Blood Transfusion Information  A transfusion is the replacement of blood or some of its parts. Blood is made up of multiple cells which provide different functions.  Red blood cells carry oxygen and are used for blood loss replacement.  White blood cells fight against infection.  Platelets control bleeding.  Plasma helps clot blood.  Other blood products are available for specialized needs, such as hemophilia or other clotting disorders. BEFORE THE TRANSFUSION  Who gives blood for transfusions?   Healthy volunteers who are fully evaluated to make sure their blood is safe. This is blood bank blood. Transfusion therapy is the safest it has ever been in the practice of medicine. Before blood is taken from a donor, a complete history is taken to make sure that person has no history of diseases nor engages in risky social behavior (examples are intravenous drug use or sexual activity  with multiple partners). The donor's travel history is screened to minimize risk of transmitting infections, such as malaria. The donated blood is tested for signs of infectious diseases, such as HIV and hepatitis. The blood is then tested to be sure it is compatible with you in order to minimize the chance of a transfusion reaction. If you or a relative donates blood, this is often done in anticipation of surgery and is not appropriate for emergency situations. It takes many days to process the donated blood. RISKS AND COMPLICATIONS Although transfusion therapy is very safe and saves many lives, the main dangers of transfusion include:   Getting an infectious disease.  Developing a transfusion reaction. This is an allergic reaction to something in the blood you were given. Every precaution is taken to prevent this. The decision to have a blood transfusion has been considered carefully by your caregiver before blood is given. Blood is not given unless the benefits outweigh the risks. AFTER THE TRANSFUSION  Right after receiving a blood transfusion, you will usually feel much better and more energetic. This is especially true if your red blood cells have gotten low (anemic). The transfusion raises the level of the red blood cells which carry oxygen, and this usually causes an energy increase.  The nurse administering the transfusion will  monitor you carefully for complications. HOME CARE INSTRUCTIONS  No special instructions are needed after a transfusion. You may find your energy is better. Speak with your caregiver about any limitations on activity for underlying diseases you may have. SEEK MEDICAL CARE IF:   Your condition is not improving after your transfusion.  You develop redness or irritation at the intravenous (IV) site. SEEK IMMEDIATE MEDICAL CARE IF:  Any of the following symptoms occur over the next 12 hours:  Shaking chills.  You have a temperature by mouth above 102 F (38.9  C), not controlled by medicine.  Chest, back, or muscle pain.  People around you feel you are not acting correctly or are confused.  Shortness of breath or difficulty breathing.  Dizziness and fainting.  You get a rash or develop hives.  You have a decrease in urine output.  Your urine turns a dark color or changes to pink, red, or brown. Any of the following symptoms occur over the next 10 days:  You have a temperature by mouth above 102 F (38.9 C), not controlled by medicine.  Shortness of breath.  Weakness after normal activity.  The white part of the eye turns yellow (jaundice).  You have a decrease in the amount of urine or are urinating less often.  Your urine turns a dark color or changes to pink, red, or brown. Document Released: 02/27/2000 Document Revised: 05/24/2011 Document Reviewed: 10/16/2007 [employer] Patient Information 2014 [place], [place].  _______________________________________________________________________

## 2016-08-26 ENCOUNTER — Ambulatory Visit (HOSPITAL_COMMUNITY)
Admission: RE | Admit: 2016-08-26 | Discharge: 2016-08-26 | Disposition: A | Discharge status: Home / Self Care | Payer: BLUE CROSS/BLUE SHIELD | Source: Ambulatory Visit | Attending: Anesthesiology | Admitting: Anesthesiology

## 2016-08-26 ENCOUNTER — Encounter (HOSPITAL_COMMUNITY)
Admission: RE | Admit: 2016-08-26 | Discharge: 2016-08-26 | Disposition: A | Discharge status: Home / Self Care | Payer: BLUE CROSS/BLUE SHIELD | Source: Ambulatory Visit | Attending: Urology | Admitting: Urology

## 2016-08-26 ENCOUNTER — Encounter (HOSPITAL_COMMUNITY): Payer: Self-pay

## 2016-08-26 DIAGNOSIS — Z01812 Encounter for preprocedural laboratory examination: Secondary | ICD-10-CM | POA: Insufficient documentation

## 2016-08-26 DIAGNOSIS — Z01810 Encounter for preprocedural cardiovascular examination: Secondary | ICD-10-CM | POA: Insufficient documentation

## 2016-08-26 DIAGNOSIS — Z01818 Encounter for other preprocedural examination: Secondary | ICD-10-CM | POA: Insufficient documentation

## 2016-08-26 LAB — BASIC METABOLIC PANEL
Anion gap: 9 (ref 5–15)
BUN: 24 mg/dL — AB (ref 6–20)
CHLORIDE: 106 mmol/L (ref 101–111)
CO2: 25 mmol/L (ref 22–32)
Calcium: 9.1 mg/dL (ref 8.9–10.3)
Creatinine, Ser: 1.04 mg/dL (ref 0.61–1.24)
GFR calc non Af Amer: >60 mL/min (ref >60)
Glucose, Bld: 114 mg/dL — ABNORMAL HIGH (ref 65–99)
POTASSIUM: 3.8 mmol/L (ref 3.5–5.1)
SODIUM: 140 mmol/L (ref 135–145)

## 2016-08-26 LAB — CBC
HEMATOCRIT: 42.5 % (ref 39.0–52.0)
Hemoglobin: 14.5 g/dL (ref 13.0–17.0)
MCH: 31 pg (ref 26.0–34.0)
MCHC: 34.1 g/dL (ref 30.0–36.0)
MCV: 90.8 fL (ref 78.0–100.0)
Platelets: 214 10*3/uL (ref 150–400)
RBC: 4.68 MIL/uL (ref 4.22–5.81)
RDW: 12.5 % (ref 11.5–15.5)
WBC: 8 10*3/uL (ref 4.0–10.5)

## 2016-08-26 NOTE — Progress Notes (Signed)
08-26-13 BMP result, routed to Dr. Alinda Money for review.

## 2016-08-27 LAB — ABO/RH: ABO/RH(D): B POS

## 2016-08-27 NOTE — H&P (Signed)
CC/HPI: CC: Prostate Cancer     Mr. Kedzierski is a 61 year old gentleman who was found to have a persistently elevated PSA of 3.6 prompting a TRUS biopsy of the prostate on 06/18/16. This confirmed Gleason 3+4=7 adenocarcinoma of the prostate with 4 out of 12 biopsy cores positive for malignancy.   Family history: None.   Imaging studies: None.   PMH: He has a history of hypercholesterolemia.  PSH: No abdominal surgeries.   TNM stage: cT1c Nx Mx  PSA: 3.6  Gleason score: 3+4=7  Biopsy (06/18/16): 4/12 cores positive  Left: L lateral apex (90%, 3+4=7, PNI), L lateral mid (50%, 3+3=6), L lateral base (5%, 3+3=6)  Right: R apex (<5%, 3+3=6)  Prostate volume: 26.5 cc   Nomogram  OC disease: 50%  EPE: 49%  SVI: 3%  LNI: 3%  PFS (5 year, 10 year): 90%, 82%   Urinary function: IPSS is 13. He has significant LUTS. He takes tamsulosin.  Erectile function: SHIM score is 15. He does not take medication. He can get erections adequate for intercourse most of the time.     ALLERGIES: None   MEDICATIONS: Tamsulosin Hcl 0.4 mg capsule, ext release 24 hr 1 capsule PO Daily  Etodolac 400 mg tablet 1 tablet PO Daily  Multivitamin     GU PSH: Complex Uroflow - 06/18/2016 Prostate Needle Biopsy - 06/18/2016    NON-GU PSH: Surgical Pathology, Gross And Microscopic Examination For Prostate Needle - 06/18/2016    GU PMH: Prostate Cancer, He has a T1c Nx Mx Gleason 7(3+4) prostate cancer with severe LUTS and moderate ED. - 07/21/2016 BPH w/LUTS, He has moderate LUTS. I am going to start him on tamsulosin and reviewed the side effects. I will get a flowrate, PVR and IPSS on return. - 05/12/2016 Weak Urinary Stream - 05/12/2016    NON-GU PMH: Arthritis Hypercholesterolemia    FAMILY HISTORY: None   SOCIAL HISTORY: Marital Status: Married Current Smoking Status: Patient has never smoked.   Tobacco Use Assessment Completed: Used Tobacco in last 30 days? Drinks 1 caffeinated drink per day.      Notes: 2 sons    REVIEW OF SYSTEMS:    GU Review Male:   Patient denies frequent urination, hard to postpone urination, burning/ pain with urination, get up at night to urinate, leakage of urine, stream starts and stops, trouble starting your streams, and have to strain to urinate .  Gastrointestinal (Upper):   Patient denies nausea and vomiting.  Gastrointestinal (Lower):   Patient denies diarrhea and constipation.  Constitutional:   Patient denies fever, night sweats, weight loss, and fatigue.  Skin:   Patient denies skin rash/ lesion and itching.  Eyes:   Patient denies blurred vision and double vision.  Ears/ Nose/ Throat:   Patient denies sore throat and sinus problems.  Hematologic/Lymphatic:   Patient denies easy bruising and swollen glands.  Cardiovascular:   Patient denies leg swelling and chest pains.  Respiratory:   Patient denies cough and shortness of breath.  Endocrine:   Patient denies excessive thirst.  Musculoskeletal:   Patient denies back pain and joint pain.  Neurological:   Patient denies headaches and dizziness.  Psychologic:   Patient denies depression and anxiety.   VITAL SIGNS:     Weight 156 lb / 70.76 kg  Height 67 in / 170.18 cm  BMI 24.4 kg/m    MULTI-SYSTEM PHYSICAL EXAMINATION:    Constitutional: Well-nourished. No physical deformities. Normally developed. Good grooming.  Neck: Neck symmetrical, not swollen. Normal tracheal position.  Respiratory: No labored breathing, no use of accessory muscles. Clear bilaterally  Cardiovascular: Normal temperature, normal extremity pulses, no swelling, no varicosities. Regular rate and rhythm.  Lymphatic: No enlargement of neck, axillae, groin.  Skin: No paleness, no jaundice, no cyanosis. No lesion, no ulcer, no rash.  Neurologic / Psychiatric: Oriented to time, oriented to place, oriented to person. No depression, no anxiety, no agitation.  Gastrointestinal: No mass, no tenderness, no rigidity, non obese abdomen.   Eyes: Normal conjunctivae. Normal eyelids.  Ears, Nose, Mouth, and Throat: Left ear no scars, no lesions, no masses. Right ear no scars, no lesions, no masses. Nose no scars, no lesions, no masses. Normal hearing. Normal lips.  Musculoskeletal: Normal gait and station of head and neck.       ASSESSMENT:      ICD-10 Details  1 GU:   Prostate Cancer - C61    PLAN:       1. Prostate cancer: He has elected to undergo surgical therapy and we'll proceed witha bilateral nerve sparing robot-assisted laparoscopic radical prostatectomy and bilateral pelvic lymphadenectomy.

## 2016-08-30 ENCOUNTER — Ambulatory Visit (HOSPITAL_COMMUNITY): Payer: BLUE CROSS/BLUE SHIELD | Admitting: Anesthesiology

## 2016-08-30 ENCOUNTER — Encounter (HOSPITAL_COMMUNITY): Payer: Self-pay | Admitting: *Deleted

## 2016-08-30 ENCOUNTER — Observation Stay (HOSPITAL_COMMUNITY)
Admission: RE | Admit: 2016-08-30 | Discharge: 2016-08-31 | Disposition: A | Discharge status: Home / Self Care | Payer: BLUE CROSS/BLUE SHIELD | Source: Ambulatory Visit | Attending: Urology | Admitting: Urology

## 2016-08-30 ENCOUNTER — Encounter (HOSPITAL_COMMUNITY)
Admission: RE | Disposition: A | Discharge status: Home / Self Care | Payer: Self-pay | Source: Ambulatory Visit | Attending: Urology

## 2016-08-30 DIAGNOSIS — C61 Malignant neoplasm of prostate: Principal | ICD-10-CM | POA: Diagnosis present

## 2016-08-30 DIAGNOSIS — Z79899 Other long term (current) drug therapy: Secondary | ICD-10-CM | POA: Insufficient documentation

## 2016-08-30 HISTORY — PX: LYMPHADENECTOMY: SHX5960

## 2016-08-30 HISTORY — PX: ROBOT ASSISTED LAPAROSCOPIC RADICAL PROSTATECTOMY: SHX5141

## 2016-08-30 LAB — TYPE AND SCREEN
ABO/RH(D): B POS
ANTIBODY SCREEN: NEGATIVE

## 2016-08-30 LAB — HEMOGLOBIN AND HEMATOCRIT, BLOOD
HCT: 40.7 % (ref 39.0–52.0)
Hemoglobin: 14.1 g/dL (ref 13.0–17.0)

## 2016-08-30 SURGERY — XI ROBOTIC ASSISTED LAPAROSCOPIC RADICAL PROSTATECTOMY LEVEL 2
Anesthesia: General

## 2016-08-30 MED ORDER — HYDROMORPHONE HCL 1 MG/ML IJ SOLN: 0.2500 mg | INTRAMUSCULAR | Status: DC | PRN

## 2016-08-30 MED ORDER — LIDOCAINE 2% (20 MG/ML) 5 ML SYRINGE
INTRAMUSCULAR | Status: DC | PRN
  Administered 2016-08-30: 80 mg via INTRAVENOUS

## 2016-08-30 MED ORDER — SULFAMETHOXAZOLE-TRIMETHOPRIM 800-160 MG PO TABS
1.0000 | ORAL_TABLET | Freq: Two times a day (BID) | ORAL | 0 refills | Status: DC
Start: 2016-08-30 — End: 2016-09-09

## 2016-08-30 MED ORDER — HYDROMORPHONE HCL 2 MG/ML IJ SOLN
INTRAMUSCULAR | Status: AC
  Filled 2016-08-30: qty 1

## 2016-08-30 MED ORDER — ROCURONIUM BROMIDE 50 MG/5ML IV SOSY
PREFILLED_SYRINGE | INTRAVENOUS | Status: AC
  Filled 2016-08-30: qty 5

## 2016-08-30 MED ORDER — DEXAMETHASONE SODIUM PHOSPHATE 10 MG/ML IJ SOLN
INTRAMUSCULAR | Status: DC | PRN
  Administered 2016-08-30: 10 mg via INTRAVENOUS

## 2016-08-30 MED ORDER — KETOROLAC TROMETHAMINE 15 MG/ML IJ SOLN
15.0000 mg | Freq: Four times a day (QID) | INTRAMUSCULAR | Status: DC
  Administered 2016-08-30 – 2016-08-31 (×4): 15 mg via INTRAVENOUS
  Filled 2016-08-30 (×4): qty 1

## 2016-08-30 MED ORDER — SUCCINYLCHOLINE CHLORIDE 200 MG/10ML IV SOSY
PREFILLED_SYRINGE | INTRAVENOUS | Status: AC
  Filled 2016-08-30: qty 10

## 2016-08-30 MED ORDER — CEFAZOLIN SODIUM-DEXTROSE 1-4 GM/50ML-% IV SOLN
1.0000 g | Freq: Three times a day (TID) | INTRAVENOUS | Status: AC
  Administered 2016-08-30 (×2): 1 g via INTRAVENOUS
  Filled 2016-08-30 (×2): qty 50

## 2016-08-30 MED ORDER — DIPHENHYDRAMINE HCL 50 MG/ML IJ SOLN: 12.5000 mg | Freq: Four times a day (QID) | INTRAMUSCULAR | Status: DC | PRN

## 2016-08-30 MED ORDER — DIPHENHYDRAMINE HCL 12.5 MG/5ML PO ELIX: 12.5000 mg | ORAL_SOLUTION | Freq: Four times a day (QID) | ORAL | Status: DC | PRN

## 2016-08-30 MED ORDER — CEFAZOLIN SODIUM-DEXTROSE 2-4 GM/100ML-% IV SOLN
INTRAVENOUS | Status: AC
  Filled 2016-08-30: qty 100

## 2016-08-30 MED ORDER — BUPIVACAINE-EPINEPHRINE 0.25% -1:200000 IJ SOLN
INTRAMUSCULAR | Status: DC | PRN
  Administered 2016-08-30: 30 mL

## 2016-08-30 MED ORDER — HEPARIN SODIUM (PORCINE) 1000 UNIT/ML IJ SOLN
INTRAMUSCULAR | Status: AC
  Filled 2016-08-30: qty 1

## 2016-08-30 MED ORDER — MORPHINE SULFATE (PF) 10 MG/ML IV SOLN
2.0000 mg | INTRAVENOUS | Status: DC | PRN
  Administered 2016-08-30: 2 mg via INTRAVENOUS
  Filled 2016-08-30: qty 1

## 2016-08-30 MED ORDER — CEFAZOLIN SODIUM-DEXTROSE 2-4 GM/100ML-% IV SOLN
2.0000 g | INTRAVENOUS | Status: AC
  Administered 2016-08-30: 2 g via INTRAVENOUS

## 2016-08-30 MED ORDER — SODIUM CHLORIDE 0.9 % IJ SOLN
INTRAMUSCULAR | Status: AC
Start: 2016-08-30 — End: 2016-08-30
  Filled 2016-08-30: qty 10

## 2016-08-30 MED ORDER — SODIUM CHLORIDE 0.9 % IJ SOLN
INTRAMUSCULAR | Status: AC
  Filled 2016-08-30: qty 10

## 2016-08-30 MED ORDER — ONDANSETRON HCL 4 MG/2ML IJ SOLN: 4.0000 mg | INTRAMUSCULAR | Status: DC | PRN

## 2016-08-30 MED ORDER — SODIUM CHLORIDE 0.9 % IR SOLN
Status: DC | PRN
  Administered 2016-08-30: 1000 mL via INTRAVESICAL

## 2016-08-30 MED ORDER — HYDROMORPHONE HCL 1 MG/ML IJ SOLN
INTRAMUSCULAR | Status: DC | PRN
  Administered 2016-08-30 (×5): .4 mg via INTRAVENOUS

## 2016-08-30 MED ORDER — KCL IN DEXTROSE-NACL 20-5-0.45 MEQ/L-%-% IV SOLN
INTRAVENOUS | Status: DC
  Administered 2016-08-30 – 2016-08-31 (×3): via INTRAVENOUS
  Filled 2016-08-30 (×4): qty 1000

## 2016-08-30 MED ORDER — MIDAZOLAM HCL 2 MG/2ML IJ SOLN
INTRAMUSCULAR | Status: DC | PRN
  Administered 2016-08-30: 2 mg via INTRAVENOUS

## 2016-08-30 MED ORDER — PROMETHAZINE HCL 25 MG/ML IJ SOLN: 6.2500 mg | INTRAMUSCULAR | Status: DC | PRN

## 2016-08-30 MED ORDER — OXYCODONE HCL 5 MG PO TABS: 5.0000 mg | ORAL_TABLET | Freq: Once | ORAL | Status: DC | PRN

## 2016-08-30 MED ORDER — SUGAMMADEX SODIUM 200 MG/2ML IV SOLN
INTRAVENOUS | Status: AC
  Filled 2016-08-30: qty 2

## 2016-08-30 MED ORDER — BUPIVACAINE-EPINEPHRINE (PF) 0.25% -1:200000 IJ SOLN
INTRAMUSCULAR | Status: AC
  Filled 2016-08-30: qty 30

## 2016-08-30 MED ORDER — EPHEDRINE 5 MG/ML INJ
INTRAVENOUS | Status: AC
  Filled 2016-08-30: qty 10

## 2016-08-30 MED ORDER — LORATADINE 10 MG PO TABS
10.0000 mg | ORAL_TABLET | Freq: Every day | ORAL | Status: DC
  Administered 2016-08-31: 10 mg via ORAL
  Filled 2016-08-30 (×2): qty 1

## 2016-08-30 MED ORDER — KETOROLAC TROMETHAMINE 15 MG/ML IJ SOLN
INTRAMUSCULAR | Status: AC
  Filled 2016-08-30: qty 1

## 2016-08-30 MED ORDER — OXYCODONE HCL 5 MG/5ML PO SOLN: 5.0000 mg | Freq: Once | ORAL | Status: DC | PRN

## 2016-08-30 MED ORDER — HYDROCODONE-ACETAMINOPHEN 5-325 MG PO TABS: 1.0000 | ORAL_TABLET | Freq: Four times a day (QID) | ORAL | 0 refills | Status: DC | PRN

## 2016-08-30 MED ORDER — SUGAMMADEX SODIUM 200 MG/2ML IV SOLN
INTRAVENOUS | Status: DC | PRN
  Administered 2016-08-30: 150 mg via INTRAVENOUS

## 2016-08-30 MED ORDER — LACTATED RINGERS IV SOLN
INTRAVENOUS | Status: DC | PRN
  Administered 2016-08-30: 1000 mL

## 2016-08-30 MED ORDER — LIDOCAINE 2% (20 MG/ML) 5 ML SYRINGE
INTRAMUSCULAR | Status: AC
  Filled 2016-08-30: qty 5

## 2016-08-30 MED ORDER — SODIUM CHLORIDE 0.9 % IV BOLUS (SEPSIS)
1000.0000 mL | Freq: Once | INTRAVENOUS | Status: AC
  Administered 2016-08-30: 1000 mL via INTRAVENOUS

## 2016-08-30 MED ORDER — DOCUSATE SODIUM 100 MG PO CAPS
100.0000 mg | ORAL_CAPSULE | Freq: Two times a day (BID) | ORAL | Status: DC
  Administered 2016-08-30 – 2016-08-31 (×2): 100 mg via ORAL
  Filled 2016-08-30 (×2): qty 1

## 2016-08-30 MED ORDER — PROPOFOL 10 MG/ML IV BOLUS
INTRAVENOUS | Status: AC
  Filled 2016-08-30: qty 20

## 2016-08-30 MED ORDER — SUFENTANIL CITRATE 50 MCG/ML IV SOLN
INTRAVENOUS | Status: AC
  Filled 2016-08-30: qty 2

## 2016-08-30 MED ORDER — ONDANSETRON HCL 4 MG/2ML IJ SOLN
INTRAMUSCULAR | Status: DC | PRN
  Administered 2016-08-30: 4 mg via INTRAVENOUS

## 2016-08-30 MED ORDER — EPHEDRINE SULFATE 50 MG/ML IJ SOLN
INTRAMUSCULAR | Status: DC | PRN
  Administered 2016-08-30: 5 mg via INTRAVENOUS

## 2016-08-30 MED ORDER — PROPOFOL 10 MG/ML IV BOLUS
INTRAVENOUS | Status: DC | PRN
  Administered 2016-08-30: 110 mg via INTRAVENOUS

## 2016-08-30 MED ORDER — DEXAMETHASONE SODIUM PHOSPHATE 10 MG/ML IJ SOLN
INTRAMUSCULAR | Status: AC
  Filled 2016-08-30: qty 1

## 2016-08-30 MED ORDER — SUFENTANIL CITRATE 50 MCG/ML IV SOLN
INTRAVENOUS | Status: DC | PRN
  Administered 2016-08-30: 15 ug via INTRAVENOUS
  Administered 2016-08-30 (×2): 10 ug via INTRAVENOUS
  Administered 2016-08-30: 5 ug via INTRAVENOUS
  Administered 2016-08-30: 10 ug via INTRAVENOUS

## 2016-08-30 MED ORDER — ONDANSETRON HCL 4 MG/2ML IJ SOLN
INTRAMUSCULAR | Status: AC
  Filled 2016-08-30: qty 2

## 2016-08-30 MED ORDER — LACTATED RINGERS IV SOLN
INTRAVENOUS | Status: DC | PRN
  Administered 2016-08-30 (×3): via INTRAVENOUS

## 2016-08-30 MED ORDER — ACETAMINOPHEN 325 MG PO TABS: 650.0000 mg | ORAL_TABLET | ORAL | Status: DC | PRN

## 2016-08-30 MED ORDER — MIDAZOLAM HCL 2 MG/2ML IJ SOLN
INTRAMUSCULAR | Status: AC
  Filled 2016-08-30: qty 2

## 2016-08-30 MED ORDER — ROCURONIUM BROMIDE 10 MG/ML (PF) SYRINGE
PREFILLED_SYRINGE | INTRAVENOUS | Status: DC | PRN
  Administered 2016-08-30: 20 mg via INTRAVENOUS
  Administered 2016-08-30: 10 mg via INTRAVENOUS
  Administered 2016-08-30: 50 mg via INTRAVENOUS

## 2016-08-30 SURGICAL SUPPLY — 53 items
APPLICATOR COTTON TIP 6IN STRL (MISCELLANEOUS) ×4 IMPLANT
CATH FOLEY 2WAY SLVR 18FR 30CC (CATHETERS) ×4 IMPLANT
CATH ROBINSON RED A/P 16FR (CATHETERS) ×4 IMPLANT
CATH ROBINSON RED A/P 8FR (CATHETERS) ×4 IMPLANT
CATH TIEMANN FOLEY 18FR 5CC (CATHETERS) ×4 IMPLANT
CHLORAPREP W/TINT 26ML (MISCELLANEOUS) ×4 IMPLANT
CLIP LIGATING HEM O LOK PURPLE (MISCELLANEOUS) ×8 IMPLANT
COVER SURGICAL LIGHT HANDLE (MISCELLANEOUS) ×4 IMPLANT
COVER TIP SHEARS 8 DVNC (MISCELLANEOUS) ×2 IMPLANT
COVER TIP SHEARS 8MM DA VINCI (MISCELLANEOUS) ×2
CUTTER ECHEON FLEX ENDO 45 340 (ENDOMECHANICALS) ×4 IMPLANT
DECANTER SPIKE VIAL GLASS SM (MISCELLANEOUS) IMPLANT
DERMABOND ADVANCED (GAUZE/BANDAGES/DRESSINGS) ×2
DERMABOND ADVANCED .7 DNX12 (GAUZE/BANDAGES/DRESSINGS) ×2 IMPLANT
DRAPE ARM DVNC X/XI (DISPOSABLE) ×8 IMPLANT
DRAPE COLUMN DVNC XI (DISPOSABLE) ×2 IMPLANT
DRAPE DA VINCI XI ARM (DISPOSABLE) ×8
DRAPE DA VINCI XI COLUMN (DISPOSABLE) ×2
DRAPE SURG IRRIG POUCH 19X23 (DRAPES) ×4 IMPLANT
DRSG TEGADERM 4X4.75 (GAUZE/BANDAGES/DRESSINGS) ×4 IMPLANT
ELECT REM PT RETURN 15FT ADLT (MISCELLANEOUS) ×4 IMPLANT
GLOVE BIO SURGEON STRL SZ 6.5 (GLOVE) ×3 IMPLANT
GLOVE BIO SURGEONS STRL SZ 6.5 (GLOVE) ×1
GLOVE BIOGEL M STRL SZ7.5 (GLOVE) ×8 IMPLANT
GOWN STRL REUS W/TWL LRG LVL3 (GOWN DISPOSABLE) ×12 IMPLANT
HOLDER FOLEY CATH W/STRAP (MISCELLANEOUS) ×4 IMPLANT
IRRIG SUCT STRYKERFLOW 2 WTIP (MISCELLANEOUS) ×4
IRRIGATION SUCT STRKRFLW 2 WTP (MISCELLANEOUS) ×2 IMPLANT
IV LACTATED RINGERS 1000ML (IV SOLUTION) IMPLANT
NDL SAFETY ECLIPSE 18X1.5 (NEEDLE) ×2 IMPLANT
NEEDLE HYPO 18GX1.5 SHARP (NEEDLE) ×2
PACK ROBOT UROLOGY CUSTOM (CUSTOM PROCEDURE TRAY) ×4 IMPLANT
SEAL CANN UNIV 5-8 DVNC XI (MISCELLANEOUS) ×8 IMPLANT
SEAL XI 5MM-8MM UNIVERSAL (MISCELLANEOUS) ×8
SOLUTION ELECTROLUBE (MISCELLANEOUS) ×4 IMPLANT
STAPLE RELOAD 45 GRN (STAPLE) ×2 IMPLANT
STAPLE RELOAD 45MM GREEN (STAPLE) ×2
SUT ETHILON 3 0 PS 1 (SUTURE) ×4 IMPLANT
SUT MNCRL 3 0 RB1 (SUTURE) ×2 IMPLANT
SUT MNCRL 3 0 VIOLET RB1 (SUTURE) ×2 IMPLANT
SUT MNCRL AB 4-0 PS2 18 (SUTURE) ×8 IMPLANT
SUT MONOCRYL 3 0 RB1 (SUTURE) ×4
SUT VIC AB 0 CT1 27 (SUTURE) ×2
SUT VIC AB 0 CT1 27XBRD ANTBC (SUTURE) ×2 IMPLANT
SUT VIC AB 0 UR5 27 (SUTURE) ×4 IMPLANT
SUT VIC AB 2-0 SH 27 (SUTURE) ×2
SUT VIC AB 2-0 SH 27X BRD (SUTURE) ×2 IMPLANT
SUT VICRYL 0 UR6 27IN ABS (SUTURE) ×8 IMPLANT
SYR 27GX1/2 1ML LL SAFETY (SYRINGE) ×4 IMPLANT
TOWEL OR 17X26 10 PK STRL BLUE (TOWEL DISPOSABLE) ×4 IMPLANT
TOWEL OR NON WOVEN STRL DISP B (DISPOSABLE) ×4 IMPLANT
TUBING INSUFFLATION 10FT LAP (TUBING) IMPLANT
WATER STERILE IRR 1000ML POUR (IV SOLUTION) IMPLANT

## 2016-08-30 NOTE — Progress Notes (Signed)
Post-op note  Subjective: The patient is doing well.  No complaints.  Eating soup  Objective: Vital signs in last 24 hours: Temp:  [97.6 F (36.4 C)-98.4 F (36.9 C)] 97.6 F (36.4 C) (06/18 1234) Pulse Rate:  [60-82] 76 (06/18 1234) Resp:  [8-18] 12 (06/18 1234) BP: (118-150)/(74-93) 125/74 (06/18 1234) SpO2:  [95 %-100 %] 99 % (06/18 1234) Weight:  [71 kg (156 lb 8 oz)] 71 kg (156 lb 8 oz) (06/18 0543)  Intake/Output from previous day: No intake/output data recorded. Intake/Output this shift: Total I/O In: 3500 [I.V.:2500; IV Piggyback:1000] Out: 545 [Urine:200; Drains:120; Blood:225]  Physical Exam:  General: Alert and oriented. Abdomen: Soft, Nondistended. Incisions: Clean and dry. Urine: pink  Lab Results:  Recent Labs  08/30/16 1100  HGB 14.1  HCT 40.7    Assessment/Plan: POD#0   1) Continue to monitor  2) DVT prophy, clears, IS, amb, pain control   LOS: 0 days   Peter Norman 08/30/2016, 3:12 PM

## 2016-08-30 NOTE — Transfer of Care (Signed)
Immediate Anesthesia Transfer of Care Note  Patient: Peter Norman  Procedure(s) Performed: Procedure(s): XI ROBOTIC ASSISTED LAPAROSCOPIC RADICAL PROSTATECTOMY LEVEL 2 (N/A) LYMPHADENECTOMY (Bilateral)  Patient Location: PACU  Anesthesia Type:General  Level of Consciousness: awake  Airway & Oxygen Therapy: Patient Spontanous Breathing and Patient connected to face mask oxygen  Post-op Assessment: Report given to RN and Post -op Vital signs reviewed and stable  Post vital signs: Reviewed and stable  Last Vitals:  Vitals:   08/30/16 0519  BP: 118/80  Pulse: 60  Resp: 16  Temp: 36.7 C    Last Pain:  Vitals:   08/30/16 0519  TempSrc: Oral      Patients Stated Pain Goal: 3 (71/21/97 5883)  Complications: No apparent anesthesia complications

## 2016-08-30 NOTE — Discharge Instructions (Signed)

## 2016-08-30 NOTE — Anesthesia Preprocedure Evaluation (Signed)
Anesthesia Evaluation  Patient identified by MRN, date of birth, ID band Patient awake    Reviewed: Allergy & Precautions, NPO status , Patient's Chart, lab work & pertinent test results  Airway Mallampati: III  TM Distance: >3 FB Neck ROM: Full    Dental no notable dental hx.    Pulmonary neg pulmonary ROS, former smoker,    Pulmonary exam normal breath sounds clear to auscultation       Cardiovascular Exercise Tolerance: Good negative cardio ROS Normal cardiovascular exam Rhythm:Regular Rate:Normal     Neuro/Psych negative neurological ROS  negative psych ROS   GI/Hepatic negative GI ROS, Neg liver ROS,   Endo/Other  negative endocrine ROS  Renal/GU negative Renal ROS  negative genitourinary   Musculoskeletal negative musculoskeletal ROS (+)   Abdominal   Peds negative pediatric ROS (+)  Hematology negative hematology ROS (+)   Anesthesia Other Findings   Reproductive/Obstetrics negative OB ROS                             Anesthesia Physical Anesthesia Plan  ASA: II  Anesthesia Plan: General   Post-op Pain Management:    Induction: Intravenous  PONV Risk Score and Plan: 2 and Ondansetron, Dexamethasone, Propofol and Midazolam  Airway Management Planned: Oral ETT  Additional Equipment:   Intra-op Plan:   Post-operative Plan: Extubation in OR  Informed Consent: I have reviewed the patients History and Physical, chart, labs and discussed the procedure including the risks, benefits and alternatives for the proposed anesthesia with the patient or authorized representative who has indicated his/her understanding and acceptance.   Dental advisory given  Plan Discussed with: CRNA  Anesthesia Plan Comments:         Anesthesia Quick Evaluation

## 2016-08-30 NOTE — Op Note (Signed)
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 Robotic assisted laparoscopic radical prostatectomy (bilateral nerve sparing) 2. Bilateral robotic assisted laparoscopic pelvic lymphadenectomy  Surgeon: [name]. M.D.  Assistant: Debbrah Alar, PA-C  An assistant was required for this surgical procedure.  The duties of the assistant included but were not limited to suctioning, passing suture, camera manipulation, retraction. This procedure would not be able to be performed without an Environmental consultant.  Resident: Dr. Lorayne Bender  Anesthesia: General  Complications: None  EBL: 225 mL  IVF:  2000 mL crystalloid  Specimens: 1. Prostate and seminal vesicles 2. Right pelvic lymph nodes 3. Left pelvic lymph nodes  Disposition of specimens: Pathology  Drains: 1. 20 Fr coude catheter 2. # 19 Blake pelvic drain  Indication: Peter Norman is a 61 y.o. year old patient with clinically localized prostate cancer.  After a thorough review of the management options for treatment of prostate cancer, he elected to proceed with surgical therapy and the above procedure(s).  We have discussed the potential benefits and risks of the procedure, side effects of the proposed treatment, the likelihood of the patient achieving the goals of the procedure, and any potential problems that might occur during the procedure or recuperation. Informed consent has been obtained.  Description of procedure:  The patient was taken to the operating room and a general anesthetic was administered. He was given preoperative antibiotics, placed in the dorsal lithotomy position, and prepped and draped in the usual sterile fashion. Next a preoperative timeout was performed. A urethral catheter was placed into the bladder and a site was selected  near the umbilicus for placement of the camera port. This was placed using a standard open Hassan technique which allowed entry into the peritoneal cavity under direct vision and without difficulty. An 8 mm robotic port was placed and a pneumoperitoneum established. The camera was then used to inspect the abdomen and there was no evidence of any intra-abdominal injuries or other abnormalities. The remaining abdominal ports were then placed. 8 mm robotic ports were placed in the right lower quadrant, left lower quadrant, and far left lateral abdominal wall. A 5 mm port was placed in the right upper quadrant and a 12 mm port was placed in the right lateral abdominal wall for laparoscopic assistance. All ports were placed under direct vision without difficulty. The surgical cart was then docked.   Utilizing the cautery scissors, the bladder was reflected posteriorly allowing entry into the space of Retzius and identification of the endopelvic fascia and prostate. The periprostatic fat was then removed from the prostate allowing full exposure of the endopelvic fascia. The endopelvic fascia was then incised from the apex back to the base of the prostate bilaterally and the underlying levator muscle fibers were swept laterally off the prostate thereby isolating the dorsal venous complex. There was a left obturator accessory artery that was preserved. The dorsal vein was then stapled and divided with a 45 mm Flex Echelon stapler. Attention then turned to the bladder neck which was divided anteriorly thereby allowing entry into the bladder and exposure of the urethral catheter. The catheter balloon was deflated and the catheter was brought into the operative field and used to retract the prostate anteriorly. The posterior bladder neck was then examined and was divided allowing further dissection between the bladder and prostate posteriorly until the vasa deferentia  and seminal vessels were identified. The vasa deferentia  were isolated, divided, and lifted anteriorly. The seminal vesicles were dissected down to their tips with care to control the seminal vascular arterial blood supply. These structures were then lifted anteriorly and the space between Denonvillier's fascia and the anterior rectum was developed with a combination of sharp and blunt dissection. This isolated the vascular pedicles of the prostate.  The lateral prostatic fascia was then sharply incised allowing release of the neurovascular bundles bilaterally. The vascular pedicles of the prostate were then ligated with Weck clips between the prostate and neurovascular bundles and divided with sharp cold scissor dissection resulting in neurovascular bundle preservation. The neurovascular bundles were then separated off the apex of the prostate and urethra bilaterally.  The urethra was then sharply transected allowing the prostate specimen to be disarticulated. The pelvis was copiously irrigated and hemostasis was ensured. There was no evidence for rectal injury.  Attention then turned to the right pelvic sidewall. The fibrofatty tissue between the external iliac vein, confluence of the iliac vessels, hypogastric artery, and Cooper's ligament was dissected free from the pelvic sidewall with care to preserve the obturator nerve. Weck clips were used for lymphostasis and hemostasis. An identical procedure was performed on the contralateral side and the lymphatic packets were removed for permanent pathologic analysis.  Attention then turned to the urethral anastomosis. A 2-0 Vicryl slip knot was placed between Denonvillier's fascia, the posterior bladder neck, and the posterior urethra to reapproximate these structures. A double-armed 3-0 Monocryl suture was then used to perform a 360 running tension-free anastomosis between the bladder neck and urethra. A new urethral catheter was then placed into the bladder and irrigated. There were no blood clots within the  bladder and the anastomosis appeared to be watertight. A #19 Blake drain was then brought through the left lateral 8 mm port site and positioned appropriately within the pelvis. It was secured to the skin with a nylon suture. The surgical cart was then undocked. The right lateral 12 mm port site was closed at the fascial level with a 0 Vicryl suture placed laparoscopically. All remaining ports were then removed under direct vision. The prostate specimen was removed intact within the Endopouch retrieval bag via the periumbilical camera port site. This fascial opening was closed with two running 0 Vicryl sutures. 0.25% Marcaine was then injected into all port sites and all incisions were reapproximated at the skin level with 4-0 Monocryl subcuticular sutures and Liquiband. The patient appeared to tolerate the procedure well and without complications. The patient was able to be extubated and transferred to the recovery unit in satisfactory condition.   [name] MD

## 2016-08-30 NOTE — Anesthesia Postprocedure Evaluation (Signed)
Anesthesia Post Note  Patient: Peter Norman  Procedure(s) Performed: Procedure(s) (LRB): XI ROBOTIC ASSISTED LAPAROSCOPIC RADICAL PROSTATECTOMY LEVEL 2 (N/A) LYMPHADENECTOMY (Bilateral)     Patient location during evaluation: PACU Anesthesia Type: General Level of consciousness: awake and alert Pain management: pain level controlled Vital Signs Assessment: post-procedure vital signs reviewed and stable Respiratory status: spontaneous breathing, nonlabored ventilation, respiratory function stable and patient connected to nasal cannula oxygen Cardiovascular status: blood pressure returned to baseline and stable Postop Assessment: no signs of nausea or vomiting Anesthetic complications: no    Last Vitals:  Vitals:   08/30/16 1215 08/30/16 1234  BP: 121/79 125/74  Pulse: 72 76  Resp: 12 12  Temp: 36.4 C 36.4 C    Last Pain:  Vitals:   08/30/16 1248  TempSrc:   PainSc: 0-No pain                 Ryan P Ellender

## 2016-08-30 NOTE — Progress Notes (Signed)
Hgb. And Hct. Drawn by lab. 

## 2016-08-30 NOTE — Anesthesia Procedure Notes (Signed)
Procedure Name: Intubation Date/Time: 08/30/2016 7:34 AM Performed by: Danley Danker L Patient Re-evaluated:Patient Re-evaluated prior to inductionOxygen Delivery Method: Circle system utilized Preoxygenation: Pre-oxygenation with 100% oxygen Intubation Type: IV induction Ventilation: Mask ventilation without difficulty and Oral airway inserted - appropriate to patient size Laryngoscope Size: Sabra Heck and 2 Grade View: Grade II Tube type: Oral Tube size: 8.0 mm Number of attempts: 1 Airway Equipment and Method: Stylet Placement Confirmation: ETT inserted through vocal cords under direct vision,  positive ETCO2 and breath sounds checked- equal and bilateral Secured at: 21 cm Tube secured with: Tape Dental Injury: Teeth and Oropharynx as per pre-operative assessment

## 2016-08-30 NOTE — Progress Notes (Signed)
Lab results noted- Hgb. 14.1-Hct. 40.7

## 2016-08-31 ENCOUNTER — Encounter (HOSPITAL_COMMUNITY): Payer: Self-pay | Admitting: Urology

## 2016-08-31 DIAGNOSIS — C61 Malignant neoplasm of prostate: Secondary | ICD-10-CM | POA: Diagnosis not present

## 2016-08-31 LAB — HEMOGLOBIN AND HEMATOCRIT, BLOOD
HCT: 36 % — ABNORMAL LOW (ref 39.0–52.0)
HCT: 35.9 % — ABNORMAL LOW (ref 39.0–52.0)
Hemoglobin: 12 g/dL — ABNORMAL LOW (ref 13.0–17.0)
Hemoglobin: 12.2 g/dL — ABNORMAL LOW (ref 13.0–17.0)

## 2016-08-31 MED ORDER — HYDROCODONE-ACETAMINOPHEN 5-325 MG PO TABS: 1.0000 | ORAL_TABLET | Freq: Four times a day (QID) | ORAL | Status: DC | PRN

## 2016-08-31 MED ORDER — BISACODYL 10 MG RE SUPP
10.0000 mg | Freq: Once | RECTAL | Status: AC
Start: 2016-08-31 — End: 2016-08-31
  Administered 2016-08-31: 10 mg via RECTAL
  Filled 2016-08-31: qty 1

## 2016-08-31 NOTE — Progress Notes (Signed)
Patient ID: Peter Norman, male   DOB: 1955/05/18, 61 y.o.   MRN: [id number]  1 Day Post-Op Subjective: The patient is doing well.  No nausea or vomiting. Pain is adequately controlled.  Objective: Vital signs in last 24 hours: Temp:  [97.5 F (36.4 C)-98.4 F (36.9 C)] 97.6 F (36.4 C) (06/19 0539) Pulse Rate:  [62-82] 62 (06/19 0539) Resp:  [8-18] 18 (06/19 0539) BP: (113-150)/(68-93) 113/68 (06/19 0539) SpO2:  [95 %-100 %] 100 % (06/19 0539)  Intake/Output from previous day: 06/18 0701 - 06/19 0700 In: 5970 [P.O.:360; I.V.:4560; IV Piggyback:1050] Out: 9758 [Urine:3550; Drains:190; Blood:225] Intake/Output this shift: No intake/output data recorded.  Physical Exam:  General: Alert and oriented. CV: RRR Lungs: Clear bilaterally. GI: Soft, Nondistended. Incisions: Clean, dry, and intact Urine: Clear Extremities: Nontender, no erythema, no edema.  Lab Results:  Recent Labs  08/30/16 1100 08/31/16 0556  HGB 14.1 12.0*  HCT 40.7 36.0*      Assessment/Plan: POD# 1 s/p robotic prostatectomy.  - Will recheck Hgb later this morning.  1) SL IVF 2) Ambulate, Incentive spirometry 3) Transition to oral pain medication 4) Dulcolax suppository 5) D/C pelvic drain 6) Plan for likely discharge later today   [name]. MD   LOS: 0 days   [name],LES 08/31/2016, 7:56 AM

## 2016-08-31 NOTE — Discharge Summary (Signed)
  Date of admission: 08/30/2016  Date of discharge: 08/31/2016  Admission diagnosis: Prostate Cancer  Discharge diagnosis: Prostate Cancer  History and Physical: For full details, please see admission history and physical. Briefly, Peter Norman is a 61 y.o. gentleman with localized prostate cancer.  After discussing management/treatment options, he elected to proceed with surgical treatment.  Hospital Course: Peter Norman was taken to the operating room on 08/30/2016 and underwent a robotic assisted laparoscopic radical prostatectomy. He tolerated this procedure well and without complications. Postoperatively, he was able to be transferred to a regular hospital room following recovery from anesthesia.  He was able to begin ambulating the night of surgery. He remained hemodynamically stable overnight.  He had excellent urine output with appropriately minimal output from his pelvic drain and his pelvic drain was removed on POD #1.  He was transitioned to oral pain medication, tolerated a clear liquid diet, and had met all discharge criteria and was able to be discharged home later on POD#1.  Laboratory values:  Recent Labs  08/30/16 1100 08/31/16 0556 08/31/16 1100  HGB 14.1 12.0* 12.2*  HCT 40.7 36.0* 35.9*    Disposition: Home  Discharge instruction: He was instructed to be ambulatory but to refrain from heavy lifting, strenuous activity, or driving. He was instructed on urethral catheter care.  Discharge medications:  Allergies as of 08/31/2016   No Known Allergies     Medication List    STOP taking these medications   ibuprofen 200 MG tablet Commonly known as:  ADVIL,MOTRIN     TAKE these medications   HYDROcodone-acetaminophen 5-325 MG tablet Commonly known as:  NORCO Take 1-2 tablets by mouth every 6 (six) hours as needed for moderate pain or severe pain.   loratadine 10 MG tablet Commonly known as:  CLARITIN Take 10 mg by mouth daily.   sulfamethoxazole-trimethoprim 800-160  MG tablet Commonly known as:  BACTRIM DS,SEPTRA DS Take 1 tablet by mouth 2 (two) times daily. Start the day prior to foley removal appointment       Followup: He will followup in 1 week for catheter removal and to discuss his surgical pathology results.

## 2016-09-07 ENCOUNTER — Ambulatory Visit (HOSPITAL_COMMUNITY)
Admission: RE | Admit: 2016-09-07 | Discharge: 2016-09-07 | Disposition: A | Discharge status: Home / Self Care | Payer: BLUE CROSS/BLUE SHIELD | Source: Ambulatory Visit | Attending: Urology | Admitting: Urology

## 2016-09-07 ENCOUNTER — Other Ambulatory Visit (HOSPITAL_COMMUNITY): Payer: Self-pay | Admitting: Urology

## 2016-09-07 DIAGNOSIS — J9811 Atelectasis: Secondary | ICD-10-CM | POA: Insufficient documentation

## 2016-09-07 DIAGNOSIS — C61 Malignant neoplasm of prostate: Secondary | ICD-10-CM

## 2016-09-07 DIAGNOSIS — R5082 Postprocedural fever: Secondary | ICD-10-CM

## 2016-09-09 ENCOUNTER — Emergency Department (HOSPITAL_COMMUNITY)
Admission: EM | Admit: 2016-09-09 | Discharge: 2016-09-09 | Disposition: A | Discharge status: Home / Self Care | Payer: BLUE CROSS/BLUE SHIELD | Attending: Emergency Medicine | Admitting: Emergency Medicine

## 2016-09-09 ENCOUNTER — Encounter (HOSPITAL_COMMUNITY): Payer: Self-pay | Admitting: Emergency Medicine

## 2016-09-09 DIAGNOSIS — R319 Hematuria, unspecified: Secondary | ICD-10-CM | POA: Diagnosis present

## 2016-09-09 DIAGNOSIS — Z8546 Personal history of malignant neoplasm of prostate: Secondary | ICD-10-CM | POA: Insufficient documentation

## 2016-09-09 DIAGNOSIS — R339 Retention of urine, unspecified: Secondary | ICD-10-CM | POA: Insufficient documentation

## 2016-09-09 DIAGNOSIS — Z87891 Personal history of nicotine dependence: Secondary | ICD-10-CM | POA: Diagnosis not present

## 2016-09-09 DIAGNOSIS — R338 Other retention of urine: Secondary | ICD-10-CM

## 2016-09-09 LAB — URINALYSIS, ROUTINE W REFLEX MICROSCOPIC

## 2016-09-09 LAB — URINALYSIS, MICROSCOPIC (REFLEX)
BACTERIA UA: NONE SEEN
Squamous Epithelial / LPF: NONE SEEN

## 2016-09-09 NOTE — ED Triage Notes (Signed)
Pt c/o pain post-op prostate removal possible urine retention

## 2016-09-09 NOTE — ED Notes (Signed)
Pt request leg bag for home use; pt given leg bag but does not want to put it on at present time. Pt verbalizes has used one before.

## 2016-09-09 NOTE — ED Provider Notes (Signed)
[facility] DEPT Provider Note   CSN: [id number] Arrival date & time: 09/09/16  7169     History   Chief Complaint Chief Complaint  Patient presents with  . Post-op Problem  . Urine Output    HPI Peter Norman is a 61 y.o. male.  HPI Patient is a 61 year old male who underwent recent prostatectomy.  This catheter was removed approximately 5 days ago and returns to the emergency department now with inability to urinate since yesterday evening.  He call the urology team who recommended he come to the ER for evaluation.  Denies nausea vomiting.  No significant abdominal pain.  No fevers or chills.  He reports his had ongoing hematuria since the procedure that has been waxing and waning.   Past Medical History:  Diagnosis Date  . Allergy     Patient Active Problem List   Diagnosis Date Noted  . Prostate cancer ([place]) 08/30/2016  . AR (allergic rhinitis) 03/29/2012    Past Surgical History:  Procedure Laterality Date  . LYMPHADENECTOMY Bilateral 08/30/2016   Procedure: LYMPHADENECTOMY;  Surgeon: Raynelle Bring, MD;  Location: [facility];  Service: Urology;  Laterality: Bilateral;  . PROSTATE SURGERY     Dr Alinda Money  . ROBOT ASSISTED LAPAROSCOPIC RADICAL PROSTATECTOMY N/A 08/30/2016   Procedure: XI ROBOTIC ASSISTED LAPAROSCOPIC RADICAL PROSTATECTOMY LEVEL 2;  Surgeon: Raynelle Bring, MD;  Location: [facility];  Service: Urology;  Laterality: N/A;       Home Medications    Prior to Admission medications   Medication Sig Start Date End Date Taking? Authorizing Provider  acetaminophen (TYLENOL) 500 MG tablet Take 1,000 mg by mouth every 6 (six) hours as needed for mild pain.   Yes [provider]  polyethylene glycol (MIRALAX / GLYCOLAX) packet Take 17 g by mouth daily as needed for mild constipation.   Yes [provider]    Family History Family History  Problem Relation Age of Onset  . Diabetes Mother   . Cancer Mother   . Heart disease Father   . Diabetes  Father   . Colon cancer Neg Hx   . Esophageal cancer Neg Hx   . Rectal cancer Neg Hx   . Stomach cancer Neg Hx     Social History Social History  Substance Use Topics  . Smoking status: Former Smoker    Quit date: 12/04/1990  . Smokeless tobacco: Never Used  . Alcohol use 1.8 oz/week    3 Glasses of wine per week     Comment: occasional glass of wine     Allergies   Patient has no known allergies.   Review of Systems Review of Systems  All other systems reviewed and are negative.    Physical Exam Updated Vital Signs BP 124/73   Pulse 76   Temp 98.4 F (36.9 C) (Oral)   Resp 18   SpO2 97%   Physical Exam  Constitutional: He is oriented to person, place, and time. He appears well-developed and well-nourished.  HENT:  Head: Normocephalic.  Eyes: EOM are normal.  Neck: Normal range of motion.  Pulmonary/Chest: Effort normal.  Abdominal: He exhibits no distension. There is no tenderness.  Genitourinary: Penis normal.  Musculoskeletal: Normal range of motion.  Neurological: He is alert and oriented to person, place, and time.  Psychiatric: He has a normal mood and affect.  Nursing note and vitals reviewed.    ED Treatments / Results  Labs (all labs ordered are listed, but only abnormal results are displayed) Labs  Reviewed  URINALYSIS, ROUTINE W REFLEX MICROSCOPIC - Abnormal; Notable for the following:       Result Value   Color, Urine RED (*)    APPearance TURBID (*)    Glucose, UA   (*)    Value: TEST NOT REPORTED DUE TO COLOR INTERFERENCE OF URINE PIGMENT   Hgb urine dipstick   (*)    Value: TEST NOT REPORTED DUE TO COLOR INTERFERENCE OF URINE PIGMENT   Bilirubin Urine   (*)    Value: TEST NOT REPORTED DUE TO COLOR INTERFERENCE OF URINE PIGMENT   Ketones, ur   (*)    Value: TEST NOT REPORTED DUE TO COLOR INTERFERENCE OF URINE PIGMENT   Protein, ur   (*)    Value: TEST NOT REPORTED DUE TO COLOR INTERFERENCE OF URINE PIGMENT   Nitrite   (*)    Value:  TEST NOT REPORTED DUE TO COLOR INTERFERENCE OF URINE PIGMENT   Leukocytes, UA   (*)    Value: TEST NOT REPORTED DUE TO COLOR INTERFERENCE OF URINE PIGMENT   All other components within normal limits  URINE CULTURE  URINALYSIS, MICROSCOPIC (REFLEX)    EKG  EKG Interpretation None       Radiology   Procedures Procedures (including critical care time)  Medications Ordered in ED Medications - No data to display   Initial Impression / Assessment and Plan / ED Course  I have reviewed the triage vital signs and the nursing notes.  Pertinent labs & imaging results that were available during my care of the patient were reviewed by me and considered in my medical decision making (see chart for details).     Foley catheter placed at the approximate 400 cc of bloody urine without clots returning.  He feels much better this time.  Home with catheter.  Urology follow-up  Final Clinical Impressions(s) / ED Diagnoses   Final diagnoses:  Acute urinary retention    New Prescriptions Discharge Medication List as of 09/09/2016  6:40 AM       Jola Schmidt, MD 09/09/16 [id number]

## 2016-09-10 DIAGNOSIS — Z8546 Personal history of malignant neoplasm of prostate: Secondary | ICD-10-CM | POA: Diagnosis not present

## 2016-09-10 DIAGNOSIS — R319 Hematuria, unspecified: Secondary | ICD-10-CM | POA: Insufficient documentation

## 2016-09-10 DIAGNOSIS — R339 Retention of urine, unspecified: Secondary | ICD-10-CM | POA: Insufficient documentation

## 2016-09-10 DIAGNOSIS — Z87891 Personal history of nicotine dependence: Secondary | ICD-10-CM | POA: Insufficient documentation

## 2016-09-10 LAB — URINE CULTURE: CULTURE: NO GROWTH

## 2016-09-10 LAB — URINALYSIS, ROUTINE W REFLEX MICROSCOPIC
Bilirubin Urine: NEGATIVE
Glucose, UA: NEGATIVE mg/dL
KETONES UR: NEGATIVE mg/dL
NITRITE: NEGATIVE
PH: 7 (ref 5.0–8.0)
Protein, ur: >=300 mg/dL — AB
SPECIFIC GRAVITY, URINE: 1.013 (ref 1.005–1.030)
Squamous Epithelial / LPF: NONE SEEN

## 2016-09-10 NOTE — ED Notes (Signed)
Pt unable to urinate since this morning. Pt had a foley put in last night. Pt followed up with urologist and it was removed. Pt still unable to urinate.

## 2016-09-11 ENCOUNTER — Emergency Department (HOSPITAL_COMMUNITY)
Admission: EM | Admit: 2016-09-11 | Discharge: 2016-09-11 | Disposition: A | Discharge status: Home / Self Care | Payer: BLUE CROSS/BLUE SHIELD | Attending: Emergency Medicine | Admitting: Emergency Medicine

## 2016-09-11 DIAGNOSIS — R339 Retention of urine, unspecified: Secondary | ICD-10-CM

## 2016-09-11 NOTE — ED Notes (Addendum)
Urine output at 0000: 57ml

## 2016-09-11 NOTE — ED Provider Notes (Signed)
[facility] DEPT Provider Note   CSN: [id number] Arrival date & time: 09/10/16  2226 By signing my name below, I, Peter Norman, attest that this documentation has been prepared under the direction and in the presence of Peter Fuel, MD . Electronically Signed: Dyke Norman, Scribe. 09/11/2016. 12:55 AM.   History   Chief Complaint Chief Complaint  Patient presents with  . Urinary Retention    HPI Peter Norman is a 61 y.o. male with a history of prostate cancer s/p prostatectomy who presents to the Emergency Department complaining of constant urinary retention onset tonight at 5-6 pm. Pt reports associated hematuria present only in his last void of the day. No OTC treatments tried for these symptoms PTA.  Pt underwent prostatectomy on 08/30/16. Two days ago, pt was seen in the ED for inability to urinate and hematuria. He was d/c home with a foley catheter in place and had catheter removed yesterday. Pt has no other acute complaints or associated symptoms at this time.    The history is provided by the patient and medical records. No language interpreter was used.    Past Medical History:  Diagnosis Date  . Allergy    Patient Active Problem List   Diagnosis Date Noted  . Prostate cancer ([place]) 08/30/2016  . AR (allergic rhinitis) 03/29/2012    Past Surgical History:  Procedure Laterality Date  . LYMPHADENECTOMY Bilateral 08/30/2016   Procedure: LYMPHADENECTOMY;  Surgeon: Raynelle Bring, MD;  Location: [facility];  Service: Urology;  Laterality: Bilateral;  . PROSTATE SURGERY     Dr Alinda Money  . ROBOT ASSISTED LAPAROSCOPIC RADICAL PROSTATECTOMY N/A 08/30/2016   Procedure: XI ROBOTIC ASSISTED LAPAROSCOPIC RADICAL PROSTATECTOMY LEVEL 2;  Surgeon: Raynelle Bring, MD;  Location: [facility];  Service: Urology;  Laterality: N/A;       Home Medications    Prior to Admission medications   Medication Sig Start Date End Date Taking? Authorizing Provider  acetaminophen (TYLENOL) 500 MG  tablet Take 1,000 mg by mouth every 6 (six) hours as needed for mild pain.    [provider]  polyethylene glycol (MIRALAX / GLYCOLAX) packet Take 17 g by mouth daily as needed for mild constipation.    [provider]    Family History Family History  Problem Relation Age of Onset  . Diabetes Mother   . Cancer Mother   . Heart disease Father   . Diabetes Father   . Colon cancer Neg Hx   . Esophageal cancer Neg Hx   . Rectal cancer Neg Hx   . Stomach cancer Neg Hx     Social History Social History  Substance Use Topics  . Smoking status: Former Smoker    Quit date: 12/04/1990  . Smokeless tobacco: Never Used  . Alcohol use 1.8 oz/week    3 Glasses of wine per week     Comment: occasional glass of wine     Allergies   Patient has no known allergies.  Review of Systems Review of Systems  Genitourinary: Positive for difficulty urinating and hematuria.  All other systems reviewed and are negative.   Physical Exam Updated Vital Signs BP (!) 147/93 (BP Location: Left Arm)   Pulse 77   Temp 98.7 F (37.1 C) (Oral)   Resp 20   SpO2 98%   Physical Exam  Constitutional: He is oriented to person, place, and time. He appears well-developed and well-nourished.  HENT:  Head: Normocephalic and atraumatic.  Eyes: EOM are normal. Pupils are equal, round,  and reactive to light.  Neck: Normal range of motion. Neck supple. No JVD present.  Cardiovascular: Normal rate, regular rhythm and normal heart sounds.   No murmur heard. Pulmonary/Chest: Effort normal and breath sounds normal. He has no wheezes. He has no rales. He exhibits no tenderness.  Abdominal: Soft. Bowel sounds are normal. He exhibits no distension and no mass. There is no tenderness.  Scars from laparoscopy present healing appropriately. Marked ecchymosis of lower abdominal wall.   Musculoskeletal: Normal range of motion. He exhibits no edema.  Lymphadenopathy:    He has no cervical adenopathy.    Neurological: He is alert and oriented to person, place, and time. No cranial nerve deficit. He exhibits normal muscle tone. Coordination normal.  Skin: Skin is warm and dry. No rash noted.  Psychiatric: He has a normal mood and affect. His behavior is normal. Judgment and thought content normal.  Nursing note and vitals reviewed.  ED Treatments / Results  DIAGNOSTIC STUDIES:  Oxygen Saturation is 98% on RA, normal by my interpretation.    COORDINATION OF CARE:  12:54 AM Discussed treatment plan with pt at bedside and pt agreed to plan.   Labs (all labs ordered are listed, but only abnormal results are displayed) Labs Reviewed  URINALYSIS, ROUTINE W REFLEX MICROSCOPIC - Abnormal; Notable for the following:       Result Value   APPearance CLOUDY (*)    Hgb urine dipstick LARGE (*)    Protein, ur >=300 (*)    Leukocytes, UA LARGE (*)    Bacteria, UA RARE (*)    All other components within normal limits    Procedures Procedures (including critical care time)  Medications Ordered in ED Medications - No data to display   Initial Impression / Assessment and Plan / ED Course  I have reviewed the triage vital signs and the nursing notes.  Pertinent lab results that were available during my care of the patient were reviewed by me and considered in my medical decision making (see chart for details).  Recurrent urinary retention. Old records reviewed, and he was seen in the ED 2 days ago with urinary retention, sent home with Foley catheter, which appear in the was removed by his urologist today. Foley catheter was reinserted prior to my evaluating him, and was draining some bloody urine. He is status post recent laparoscopic prostatectomy. He is sent home with Foley catheter in place. Urinalysis does show too numerous to count WBCs, but only rare bacteria. Decision is made to not start antibiotics, but urine was sent for culture.  Final Clinical Impressions(s) / ED Diagnoses    Final diagnoses:  Urinary retention    New Prescriptions New Prescriptions   No medications on file   I personally performed the services described in this documentation, which was scribed in my presence. The recorded information has been reviewed and is accurate.     Peter Fuel, MD 07/37/10 [phone]

## 2016-09-11 NOTE — ED Notes (Signed)
Pt refused leg bag

## 2016-09-12 LAB — URINE CULTURE: CULTURE: NO GROWTH

## 2016-11-23 ENCOUNTER — Other Ambulatory Visit: Payer: Self-pay | Admitting: Urology

## 2016-11-23 ENCOUNTER — Emergency Department (HOSPITAL_COMMUNITY)
Admission: EM | Admit: 2016-11-23 | Discharge: 2016-11-24 | Disposition: A | Discharge status: Home / Self Care | Payer: BLUE CROSS/BLUE SHIELD | Attending: Emergency Medicine | Admitting: Emergency Medicine

## 2016-11-23 ENCOUNTER — Encounter (HOSPITAL_COMMUNITY): Payer: Self-pay

## 2016-11-23 DIAGNOSIS — Z87891 Personal history of nicotine dependence: Secondary | ICD-10-CM | POA: Insufficient documentation

## 2016-11-23 DIAGNOSIS — R339 Retention of urine, unspecified: Secondary | ICD-10-CM | POA: Diagnosis not present

## 2016-11-23 LAB — I-STAT CHEM 8, ED
BUN: 27 mg/dL — ABNORMAL HIGH (ref 6–20)
CHLORIDE: 104 mmol/L (ref 101–111)
Calcium, Ion: 1.1 mmol/L — ABNORMAL LOW (ref 1.15–1.40)
Creatinine, Ser: 1.4 mg/dL — ABNORMAL HIGH (ref 0.61–1.24)
GLUCOSE: 118 mg/dL — AB (ref 65–99)
HCT: 49 % (ref 39.0–52.0)
HEMOGLOBIN: 16.7 g/dL (ref 13.0–17.0)
POTASSIUM: 3.8 mmol/L (ref 3.5–5.1)
SODIUM: 138 mmol/L (ref 135–145)
TCO2: 24 mmol/L (ref 22–32)

## 2016-11-23 LAB — URINALYSIS, ROUTINE W REFLEX MICROSCOPIC
Bacteria, UA: NONE SEEN
Bilirubin Urine: NEGATIVE
GLUCOSE, UA: NEGATIVE mg/dL
KETONES UR: NEGATIVE mg/dL
Nitrite: NEGATIVE
PROTEIN: 30 mg/dL — AB
Specific Gravity, Urine: 1.014 (ref 1.005–1.030)
pH: 6 (ref 5.0–8.0)

## 2016-11-23 MED ORDER — KETAMINE HCL-SODIUM CHLORIDE 100-0.9 MG/10ML-% IV SOSY: 1.0000 mg/kg | PREFILLED_SYRINGE | Freq: Once | INTRAVENOUS | Status: DC

## 2016-11-23 MED ORDER — KETAMINE HCL-SODIUM CHLORIDE 100-0.9 MG/10ML-% IV SOSY
PREFILLED_SYRINGE | INTRAVENOUS | Status: AC
  Administered 2016-11-23: 6 mg
  Filled 2016-11-23: qty 10

## 2016-11-23 MED ORDER — PROPOFOL 10 MG/ML IV BOLUS
10.0000 mg | Freq: Once | INTRAVENOUS | Status: AC
  Administered 2016-11-23: 6 mg via INTRAVENOUS
  Filled 2016-11-23: qty 20

## 2016-11-23 NOTE — ED Provider Notes (Addendum)
[facility] DEPT Provider Note   CSN: [id number] Arrival date & time: 11/23/16  1932     History   Chief Complaint Chief Complaint  Patient presents with  . Urinary Retention    HPI Peter Norman is a 61 y.o. male.  61 year old male presents with urinary retention 1 day. Was seen by his urologist today and attempted to pass a Foley catheter unsuccessfully. Denies any flank pain or fever or chills. Does note some significant pressure. Has been able to urinate small amounts have not been associated with dysuria.      Past Medical History:  Diagnosis Date  . Allergy     Patient Active Problem List   Diagnosis Date Noted  . Prostate cancer ([place]) 08/30/2016  . AR (allergic rhinitis) 03/29/2012    Past Surgical History:  Procedure Laterality Date  . LYMPHADENECTOMY Bilateral 08/30/2016   Procedure: LYMPHADENECTOMY;  Surgeon: Raynelle Bring, MD;  Location: [facility];  Service: Urology;  Laterality: Bilateral;  . PROSTATE SURGERY     Dr Alinda Money  . ROBOT ASSISTED LAPAROSCOPIC RADICAL PROSTATECTOMY N/A 08/30/2016   Procedure: XI ROBOTIC ASSISTED LAPAROSCOPIC RADICAL PROSTATECTOMY LEVEL 2;  Surgeon: Raynelle Bring, MD;  Location: [facility];  Service: Urology;  Laterality: N/A;       Home Medications    Prior to Admission medications   Medication Sig Start Date End Date Taking? Authorizing Provider  diphenhydrAMINE (BENADRYL) 25 MG tablet Take 25 mg by mouth every 6 (six) hours as needed for itching.   Yes [provider]  acetaminophen (TYLENOL) 500 MG tablet Take 1,000 mg by mouth every 6 (six) hours as needed for mild pain.    [provider]    Family History Family History  Problem Relation Age of Onset  . Diabetes Mother   . Cancer Mother   . Heart disease Father   . Diabetes Father   . Colon cancer Neg Hx   . Esophageal cancer Neg Hx   . Rectal cancer Neg Hx   . Stomach cancer Neg Hx     Social History Social History  Substance Use Topics    . Smoking status: Former Smoker    Quit date: 12/04/1990  . Smokeless tobacco: Never Used  . Alcohol use 1.8 oz/week    3 Glasses of wine per week     Comment: occasional glass of wine     Allergies   Patient has no known allergies.   Review of Systems Review of Systems  All other systems reviewed and are negative.    Physical Exam Updated Vital Signs BP (!) 188/103 (BP Location: Right Arm)   Pulse 81   Temp 98.1 F (36.7 C) (Oral)   Resp 20   SpO2 100%   Physical Exam  Constitutional: He is oriented to person, place, and time. He appears well-developed and well-nourished.  Non-toxic appearance. No distress.  HENT:  Head: Normocephalic and atraumatic.  Eyes: Pupils are equal, round, and reactive to light. Conjunctivae, EOM and lids are normal.  Neck: Normal range of motion. Neck supple. No tracheal deviation present. No thyroid mass present.  Cardiovascular: Normal rate, regular rhythm and normal heart sounds.  Exam reveals no gallop.   No murmur heard. Pulmonary/Chest: Effort normal and breath sounds normal. No stridor. No respiratory distress. He has no decreased breath sounds. He has no wheezes. He has no rhonchi. He has no rales.  Abdominal: Soft. Normal appearance and bowel sounds are normal. He exhibits no distension. There is tenderness in  the suprapubic area. There is no rebound and no CVA tenderness.    Musculoskeletal: Normal range of motion. He exhibits no edema or tenderness.  Neurological: He is alert and oriented to person, place, and time. He has normal strength. No cranial nerve deficit or sensory deficit. GCS eye subscore is 4. GCS verbal subscore is 5. GCS motor subscore is 6.  Skin: Skin is warm and dry. No abrasion and no rash noted.  Psychiatric: He has a normal mood and affect. His speech is normal and behavior is normal.  Nursing note and vitals reviewed.    ED Treatments / Results  Labs (all labs ordered are listed, but only abnormal results  are displayed) Labs Reviewed  URINALYSIS, ROUTINE W REFLEX MICROSCOPIC  I-STAT CHEM 8, ED    EKG  EKG Interpretation None       Radiology No results found.  Procedures .Sedation Date/Time: 11/23/2016 10:45 PM Performed by: Lacretia Leigh Authorized by: Lacretia Leigh   Consent:    Consent obtained:  Written   Consent given by:  Patient   Risks discussed:  Inadequate sedation   Alternatives discussed:  Analgesia without sedation Universal protocol:    Procedure explained and questions answered to patient or proxy's satisfaction: yes     Relevant documents present and verified: yes     Immediately prior to procedure a time out was called: yes     Patient identity confirmation method:  Arm band Indications:    Procedure performed:  Endoscopy   Procedure necessitating sedation performed by:  Physician performing sedation   Intended level of sedation:  Moderate (conscious sedation) Pre-sedation assessment:    Time since last food or drink:  800pm   NPO status caution: unable to specify NPO status     ASA classification: class 1 - normal, healthy patient     Neck mobility: normal     Mouth opening:  3 or more finger widths   Mallampati score:  I - soft palate, uvula, fauces, pillars visible   Pre-sedation assessments completed and reviewed: airway patency     Pre-sedation assessment completed:  11/23/2016 11:00 PM Immediate pre-procedure details:    Reassessment: Patient reassessed immediately prior to procedure     Reviewed: vital signs     Verified: bag valve mask available, emergency equipment available, intubation equipment available, IV patency confirmed, oxygen available and suction available   Procedure details (see MAR for exact dosages):    Sedation:  Ketamine and propofol   Intra-procedure monitoring:  Blood pressure monitoring, cardiac monitor, continuous capnometry, continuous pulse oximetry, frequent LOC assessments and frequent vital sign checks    Intra-procedure events: none     Total Provider sedation time (minutes):  20 Post-procedure details:    Post-sedation assessment completed:  11/23/2016 11:49 PM   Attendance: Constant attendance by certified staff until patient recovered     Recovery: Patient returned to pre-procedure baseline     Post-sedation assessments completed and reviewed: airway patency and cardiovascular function     Patient is stable for discharge or admission: yes     Patient tolerance:  Tolerated well, no immediate complications   (including critical care time)  Medications Ordered in ED Medications - No data to display   Initial Impression / Assessment and Plan / ED Course  I have reviewed the triage vital signs and the nursing notes.  Pertinent labs & imaging results that were available during my care of the patient were reviewed by me and considered in my medical  decision making (see chart for details).     Spoke with urologist on call he will come and see the patient.  11:50 PM Patient seen by urology and have Foley catheter placed while I provided conscious sedation. Will be discharged to home was back to baseline  Final Clinical Impressions(s) / ED Diagnoses   Final diagnoses:  None    New Prescriptions New Prescriptions   No medications on file     Lacretia Leigh, MD 11/23/16 2012    Lacretia Leigh, MD 11/23/16 2350    Lacretia Leigh, MD 11/23/16 2350

## 2016-11-23 NOTE — Progress Notes (Signed)
RT present for duration of sedation procedure.  Pt remained stable throughout procedure on EtCO2 monitoring with vital signs noted.

## 2016-11-23 NOTE — ED Triage Notes (Signed)
Pt states that he hasn't been able to urinate since 1p. He and his wife report that they have been to the urologist 2x today where they attempted to cath him, but were unsuccessful. Pt reports that  they told him to wait a few hours to see if he could urinate and if not, to come here. Pt in extreme discomfort. Hx prostate surgery in June. A&Ox4.

## 2016-11-23 NOTE — ED Notes (Signed)
Bed: WA18 Expected date:  Expected time:  Means of arrival:  Comments: Triage 2 

## 2016-11-23 NOTE — Consult Note (Signed)
Urology Consult Note   Requesting Attending Physician:  Lacretia Leigh, MD Service Providing Consult: Urology  Consulting Attending: Dr. Louis Meckel   Reason for Consult:  Urinary retention  HPI: Peter Norman is seen in consultation for reasons noted above at the request of Lacretia Leigh, MD for the evaluation of urinary retention.  This is a 61 y.o. male with a history of prostate cancer s/p RALP on 08/30/16. The patient initially passed his TOV, but has had subsequent issues with urinary retention occasionally requiring urethral catheterization. Over the past few weeks he has had worsening urinary symptoms, and was seen in clinic today, at which point Foley placement was attempted, but no successful. 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 As he was able to void a little, he was allowed to leave clinic, but he subsequently went into retention with associated bladder pain/pressure and came to the ED.   No fevers or chills or there infectio   Past Medical History: Past Medical History:  Diagnosis Date  . Allergy     Past Surgical History:  Past Surgical History:  Procedure Laterality Date  . LYMPHADENECTOMY Bilateral 08/30/2016   Procedure: LYMPHADENECTOMY;  Surgeon: Raynelle Bring, MD;  Location: [facility];  Service: Urology;  Laterality: Bilateral;  . PROSTATE SURGERY     Dr Alinda Money  . ROBOT ASSISTED LAPAROSCOPIC RADICAL PROSTATECTOMY N/A 08/30/2016   Procedure: XI ROBOTIC ASSISTED LAPAROSCOPIC RADICAL PROSTATECTOMY LEVEL 2;  Surgeon: Raynelle Bring, MD;  Location: [facility];  Service: Urology;  Laterality: N/A;    Medication: No current facility-administered medications for this encounter.    Current Outpatient Prescriptions  Medication Sig Dispense Refill  . diphenhydrAMINE (BENADRYL) 25 MG tablet Take 25 mg by mouth every 6 (six) hours as needed for itching.    Marland Kitchen acetaminophen (TYLENOL) 500 MG tablet Take 1,000 mg by mouth every  6 (six) hours as needed for mild pain.      Allergies: No Known Allergies  Social History: Social History  Substance Use Topics  . Smoking status: Former Smoker    Quit date: 12/04/1990  . Smokeless tobacco: Never Used  . Alcohol use 1.8 oz/week    3 Glasses of wine per week     Comment: occasional glass of wine    Family History Family History  Problem Relation Age of Onset  . Diabetes Mother   . Cancer Mother   . Heart disease Father   . Diabetes Father   . Colon cancer Neg Hx   . Esophageal cancer Neg Hx   . Rectal cancer Neg Hx   . Stomach cancer Neg Hx     Review of Systems 10 systems were reviewed and are negative except as noted specifically in the HPI.  Objective   Vital signs in last 24 hours: BP 127/89   Pulse 78   Temp 98.5 F (36.9 C) (Oral)   Resp 19   SpO2 100%   Intake/Output last 3 shifts: No intake/output data recorded.  Physical Exam General: NAD, A&O, resting, appropriate HEENT: [place]/AT, EOMI, MMM Pulmonary: Normal work of breathing Cardiovascular: HDS, adequate peripheral perfusion Abdomen: Soft, pressure over lower abdomen with palpation. GU: Normal external genitalia, no CVA tenderness Extremities: warm and well perfused Neuro: Appropriate, no focal neurological deficits  Most Recent Labs: Lab Results  Component Value Date   WBC 8.0 08/26/2016   HGB 16.7 11/23/2016   HCT 49.0 11/23/2016   PLT 214 08/26/2016    Lab Results  Component Value Date   NA 138 11/23/2016   K 3.8 11/23/2016   CL 104 11/23/2016   CO2 25 08/26/2016   BUN 27 (H) 11/23/2016   CREATININE 1.40 (H) 11/23/2016   CALCIUM 9.1 08/26/2016    Lab Results  Component Value Date   ALKPHOS 43 07/03/2014   BILITOT 0.3 07/03/2014   PROT 7.2 07/03/2014   ALBUMIN 4.2 07/03/2014   ALT 27 07/03/2014   AST 19 07/03/2014   Procedure: Consent was obtained. A time out was performed prior to the procedure. The ED attending was present to administer conscious  sedation. He received 1g ceftriaxone. A Flexible cystoscope was used to inspect the urethra, which revealed a tight and dense bladder neck contracture. The urethra otherwise had a normal post-op appearance. A sensor wire was placed through the [facility], and the scope was removed. A Nephromax baloon dilator was placed over the wire, with the balloon portion overlying the [place]. The balloon dilator was inflated to 24 Fr. The balloon was then taken down and removed, leaving the wire in place, over which we placed a 16Fr council-tip catheter with ease. The wire was removed and the Foley balloon was inflated with 10cc sterile water after urine return was seen. The patient was brought out of sedation having tolerated it well.   ------  Assessment:  61 y.o. 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 Unable to place Foley initially d/t BNC. Placed 16Fr Foley after balloon dilation to 24Fr under sedation in ED. See above procedure note.   Recommendations: - Patient OK for discharge. Leave Foley in place to drain. Provided with leg bag.  - Patient to follow up in ~1 week for catheter removal.  Thank you for this consult. Please contact the urology consult pager with any further questions/concerns.

## 2016-11-24 MED ORDER — DEXTROSE 5 % IV SOLN
INTRAVENOUS | Status: AC
  Filled 2016-11-24: qty 10

## 2016-11-24 MED ORDER — CEFTRIAXONE SODIUM 1 G IJ SOLR
1.0000 g | Freq: Once | INTRAMUSCULAR | Status: AC
  Administered 2016-11-24: 1 g via INTRAVENOUS

## 2016-11-24 NOTE — ED Notes (Signed)
Provider wants to walk and have pt drink fluids before d/c Provider also want IV antibiotics

## 2016-11-25 ENCOUNTER — Ambulatory Visit: Admit: 2016-11-25 | Payer: BLUE CROSS/BLUE SHIELD | Admitting: Urology

## 2016-11-25 SURGERY — CYSTOSCOPY, WITH URETHRAL DILATION
Anesthesia: General

## 2018-09-29 IMAGING — DX DG CHEST 2V
2 series · 2 of 2 positions shown · non-contrast
Comparison: None.

CLINICAL DATA: Preop for Celsius implantation

EXAM:
CHEST  2 VIEW

[chest pa]
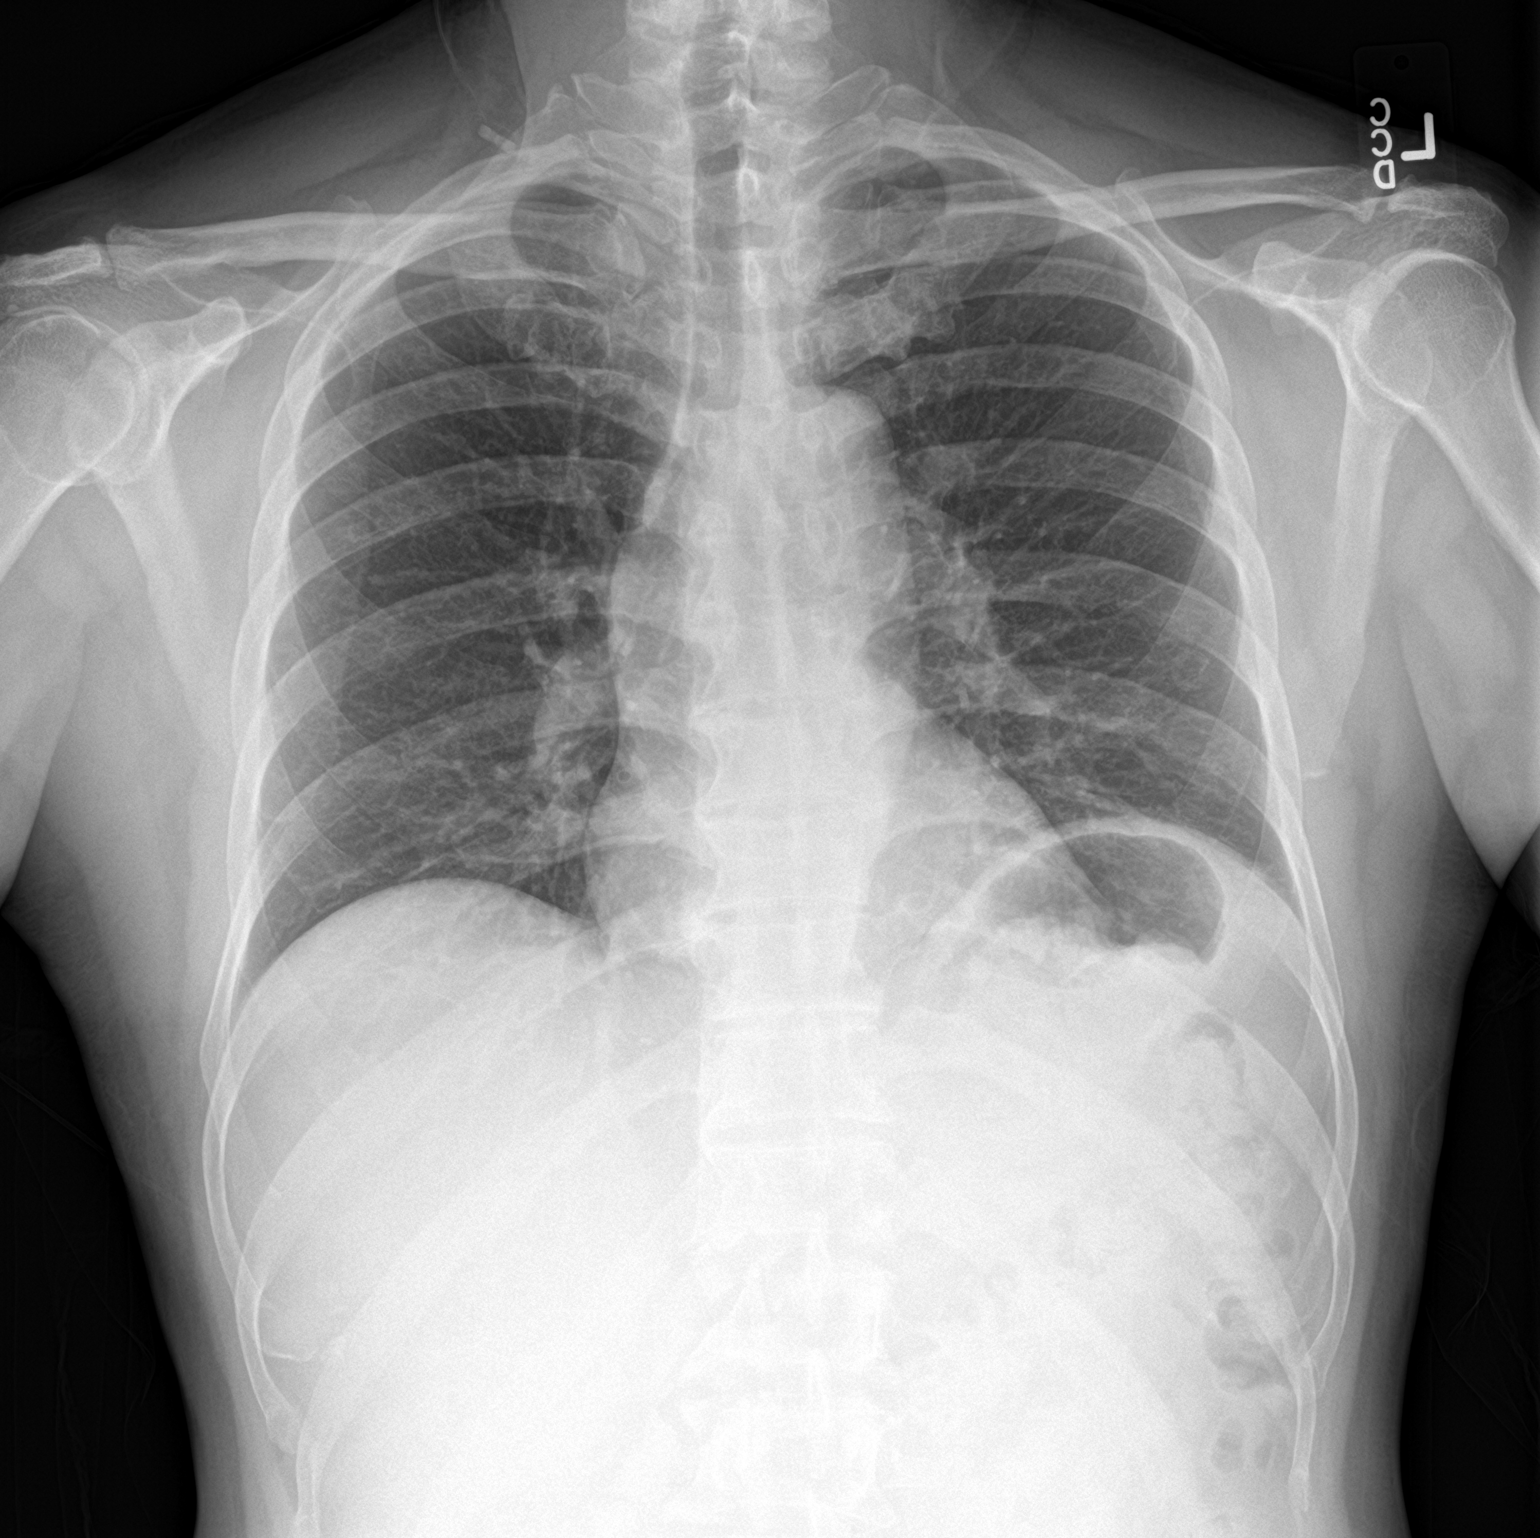

[chest lat]
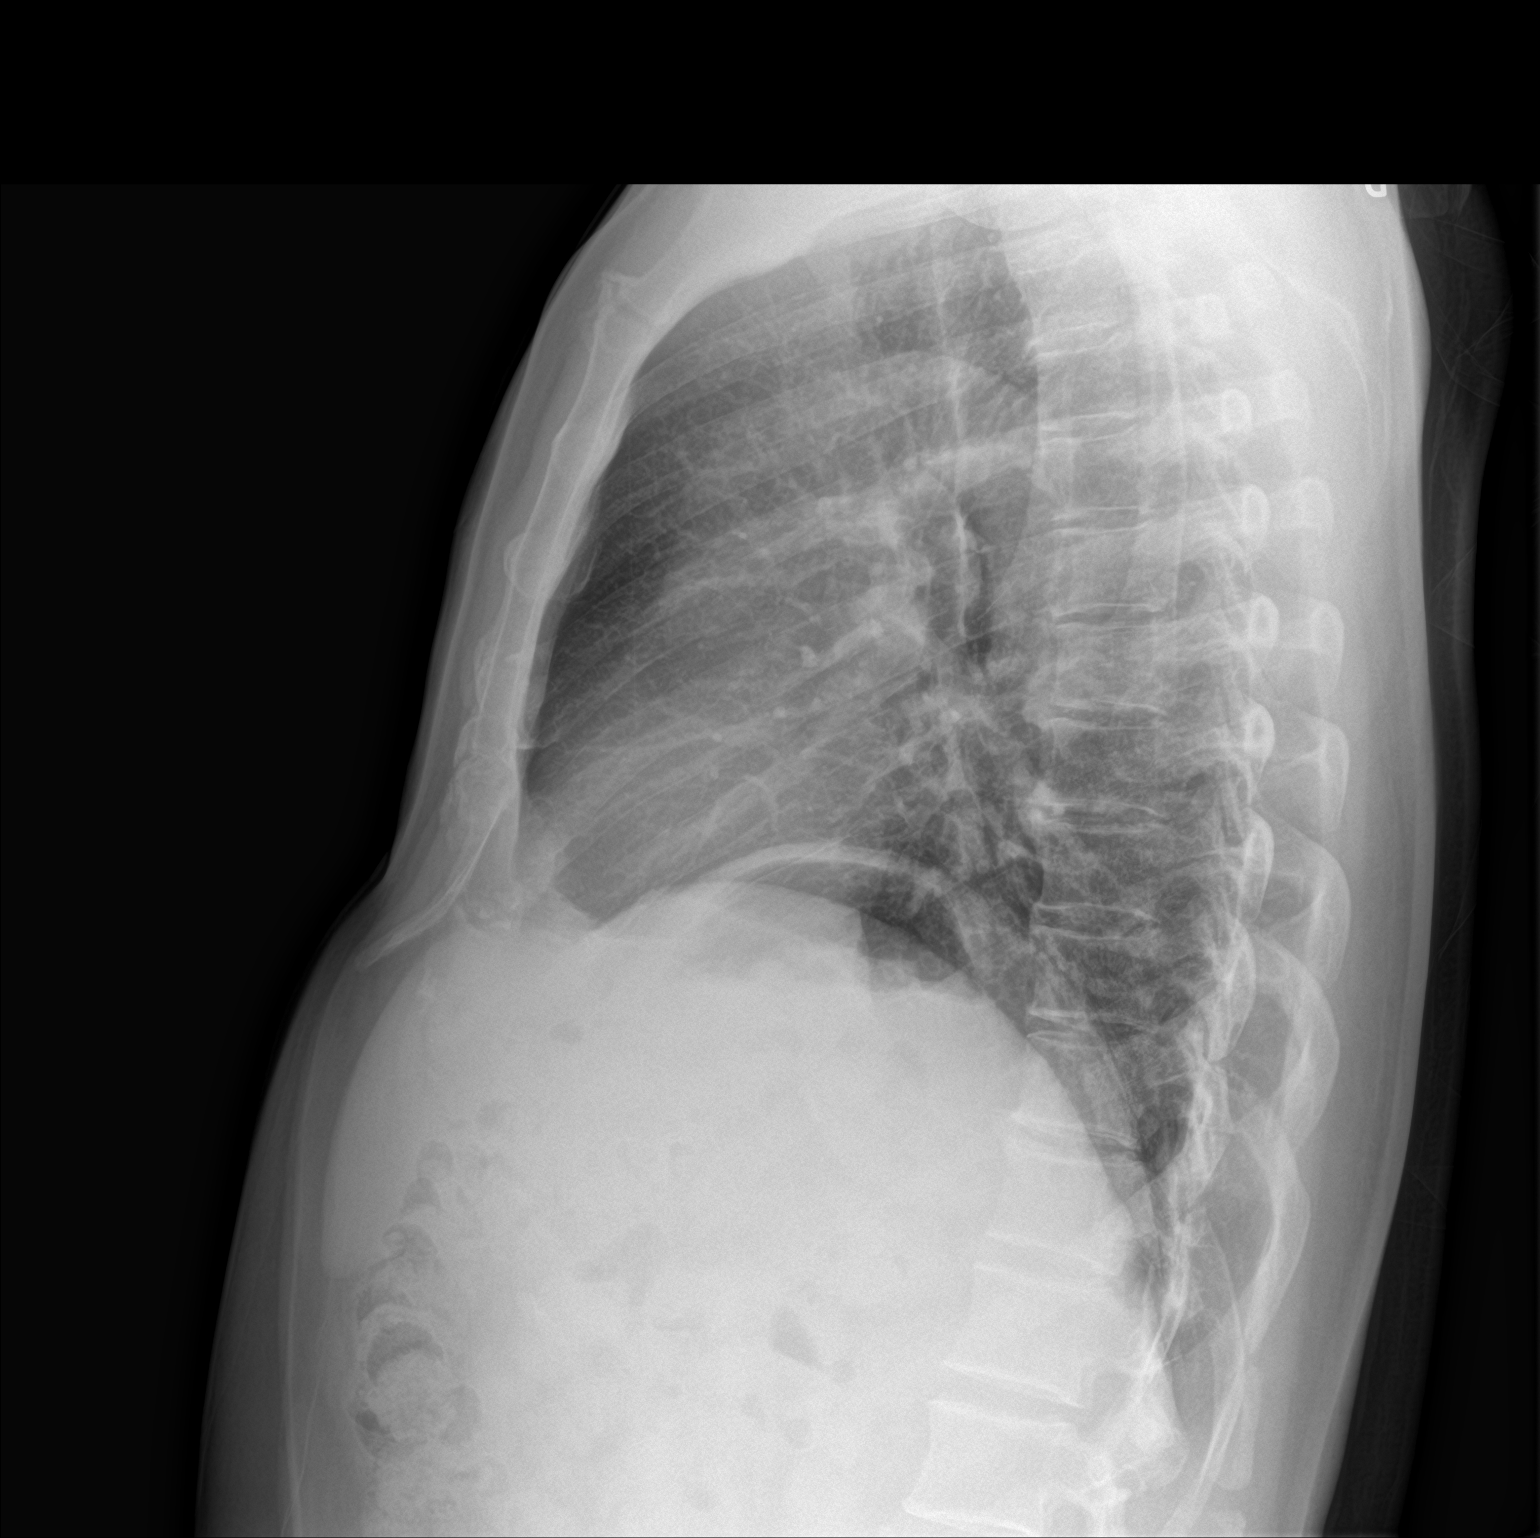

[2 of 2 positions shown; findings below may reference images not displayed]

FINDINGS: No active infiltrate or effusion is seen. Mediastinal and hilar
contours are unremarkable. The heart is within normal limits in
size. No bony abnormality is seen.
IMPRESSION: No active cardiopulmonary disease.

## 2019-05-25 ENCOUNTER — Ambulatory Visit: Payer: Medicaid Other | Attending: Internal Medicine

## 2019-05-25 DIAGNOSIS — Z23 Encounter for immunization: Secondary | ICD-10-CM

## 2019-05-25 NOTE — Progress Notes (Signed)
   Covid-19 Vaccination Clinic  Name:  Peter Norman    MRN: [id number] DOB: 01-31-1956  05/25/2019  [name] was observed post Covid-19 immunization for 15 minutes without incident. He was provided with Vaccine Information Sheet and instruction to access the V-Safe system.   [name] was instructed to call 911 with any severe reactions post vaccine: Marland Kitchen Difficulty breathing  . Swelling of face and throat  . A fast heartbeat  . A bad rash all over body  . Dizziness and weakness   Immunizations Administered    Name Date Dose VIS Date Route   Pfizer COVID-19 Vaccine 05/25/2019  1:02 PM 0.3 mL 02/23/2019 Intramuscular   Manufacturer: [place]   Lot: [id number]   [place]: [id number]

## 2019-06-18 ENCOUNTER — Ambulatory Visit: Payer: Medicaid Other | Attending: Internal Medicine

## 2019-06-18 DIAGNOSIS — Z23 Encounter for immunization: Secondary | ICD-10-CM

## 2019-06-18 NOTE — Progress Notes (Signed)
   Covid-19 Vaccination Clinic  Name:  Peter Norman    MRN: [id number] DOB: Apr 19, 1955  06/18/2019  Peter Norman was observed post Covid-19 immunization for 15 minutes without incident. He was provided with Vaccine Information Sheet and instruction to access the V-Safe system.   Peter Norman was instructed to call 911 with any severe reactions post vaccine: Marland Kitchen Difficulty breathing  . Swelling of face and throat  . A fast heartbeat  . A bad rash all over body  . Dizziness and weakness   Immunizations Administered    Name Date Dose VIS Date Route   Pfizer COVID-19 Vaccine 06/18/2019  2:00 PM 0.3 mL 02/23/2019 Intramuscular   Manufacturer: [place]   Lot: [id number]   [place]: [id number]
